# Patient Record
Sex: Female | Born: 1945 | Hispanic: No | Smoking: Never smoker
Health system: Southern US, Community
[De-identification: ages and names within clinical notes are randomized; demographics above are authoritative.]

## PROBLEM LIST (undated history)

## (undated) DIAGNOSIS — K5792 Diverticulitis of intestine, part unspecified, without perforation or abscess without bleeding: Secondary | ICD-10-CM

## (undated) DIAGNOSIS — I10 Essential (primary) hypertension: Secondary | ICD-10-CM

## (undated) DIAGNOSIS — M1990 Unspecified osteoarthritis, unspecified site: Secondary | ICD-10-CM

## (undated) DIAGNOSIS — E119 Type 2 diabetes mellitus without complications: Secondary | ICD-10-CM

## (undated) DIAGNOSIS — C50919 Malignant neoplasm of unspecified site of unspecified female breast: Secondary | ICD-10-CM

## (undated) HISTORY — PX: ABDOMINAL HYSTERECTOMY: SHX81

## (undated) HISTORY — PX: BREAST LUMPECTOMY: SHX2

## (undated) HISTORY — PX: CHOLECYSTECTOMY: SHX55

---

## 2010-11-04 ENCOUNTER — Other Ambulatory Visit
Admission: RE | Admit: 2010-11-04 | Discharge: 2010-11-04 | Payer: Self-pay | Source: Home / Self Care | Admitting: Family Medicine

## 2015-11-17 DIAGNOSIS — K143 Hypertrophy of tongue papillae: Secondary | ICD-10-CM | POA: Diagnosis not present

## 2015-12-24 DIAGNOSIS — L298 Other pruritus: Secondary | ICD-10-CM | POA: Diagnosis not present

## 2016-01-17 DIAGNOSIS — F419 Anxiety disorder, unspecified: Secondary | ICD-10-CM | POA: Diagnosis not present

## 2016-01-17 DIAGNOSIS — I10 Essential (primary) hypertension: Secondary | ICD-10-CM | POA: Diagnosis not present

## 2016-01-17 DIAGNOSIS — E7800 Pure hypercholesterolemia, unspecified: Secondary | ICD-10-CM | POA: Diagnosis not present

## 2016-01-17 DIAGNOSIS — Z7984 Long term (current) use of oral hypoglycemic drugs: Secondary | ICD-10-CM | POA: Diagnosis not present

## 2016-01-17 DIAGNOSIS — E119 Type 2 diabetes mellitus without complications: Secondary | ICD-10-CM | POA: Diagnosis not present

## 2016-01-17 DIAGNOSIS — K149 Disease of tongue, unspecified: Secondary | ICD-10-CM | POA: Diagnosis not present

## 2016-01-21 DIAGNOSIS — N9089 Other specified noninflammatory disorders of vulva and perineum: Secondary | ICD-10-CM | POA: Diagnosis not present

## 2016-02-14 DIAGNOSIS — R319 Hematuria, unspecified: Secondary | ICD-10-CM | POA: Diagnosis not present

## 2016-02-14 DIAGNOSIS — R1030 Lower abdominal pain, unspecified: Secondary | ICD-10-CM | POA: Diagnosis not present

## 2016-02-17 DIAGNOSIS — D72829 Elevated white blood cell count, unspecified: Secondary | ICD-10-CM | POA: Diagnosis not present

## 2016-02-17 DIAGNOSIS — N952 Postmenopausal atrophic vaginitis: Secondary | ICD-10-CM | POA: Diagnosis not present

## 2016-02-17 DIAGNOSIS — R1030 Lower abdominal pain, unspecified: Secondary | ICD-10-CM | POA: Diagnosis not present

## 2016-02-28 DIAGNOSIS — N6489 Other specified disorders of breast: Secondary | ICD-10-CM | POA: Diagnosis not present

## 2016-02-28 DIAGNOSIS — N63 Unspecified lump in breast: Secondary | ICD-10-CM | POA: Diagnosis not present

## 2016-02-28 DIAGNOSIS — Z853 Personal history of malignant neoplasm of breast: Secondary | ICD-10-CM | POA: Diagnosis not present

## 2016-02-28 DIAGNOSIS — Z1231 Encounter for screening mammogram for malignant neoplasm of breast: Secondary | ICD-10-CM | POA: Diagnosis not present

## 2016-03-06 DIAGNOSIS — Z170 Estrogen receptor positive status [ER+]: Secondary | ICD-10-CM | POA: Diagnosis not present

## 2016-03-06 DIAGNOSIS — R682 Dry mouth, unspecified: Secondary | ICD-10-CM | POA: Diagnosis not present

## 2016-03-06 DIAGNOSIS — Z08 Encounter for follow-up examination after completed treatment for malignant neoplasm: Secondary | ICD-10-CM | POA: Diagnosis not present

## 2016-03-06 DIAGNOSIS — T386X5A Adverse effect of antigonadotrophins, antiestrogens, antiandrogens, not elsewhere classified, initial encounter: Secondary | ICD-10-CM | POA: Diagnosis not present

## 2016-03-06 DIAGNOSIS — Z79810 Long term (current) use of selective estrogen receptor modulators (SERMs): Secondary | ICD-10-CM | POA: Diagnosis not present

## 2016-03-06 DIAGNOSIS — Z5181 Encounter for therapeutic drug level monitoring: Secondary | ICD-10-CM | POA: Diagnosis not present

## 2016-03-06 DIAGNOSIS — Z9889 Other specified postprocedural states: Secondary | ICD-10-CM | POA: Diagnosis not present

## 2016-03-06 DIAGNOSIS — D0511 Intraductal carcinoma in situ of right breast: Secondary | ICD-10-CM | POA: Diagnosis not present

## 2016-03-06 DIAGNOSIS — K117 Disturbances of salivary secretion: Secondary | ICD-10-CM | POA: Diagnosis not present

## 2016-03-06 DIAGNOSIS — N952 Postmenopausal atrophic vaginitis: Secondary | ICD-10-CM | POA: Diagnosis not present

## 2016-03-06 DIAGNOSIS — Z853 Personal history of malignant neoplasm of breast: Secondary | ICD-10-CM | POA: Diagnosis not present

## 2016-03-16 DIAGNOSIS — H04123 Dry eye syndrome of bilateral lacrimal glands: Secondary | ICD-10-CM | POA: Diagnosis not present

## 2016-03-16 DIAGNOSIS — H40013 Open angle with borderline findings, low risk, bilateral: Secondary | ICD-10-CM | POA: Diagnosis not present

## 2016-03-20 DIAGNOSIS — R358 Other polyuria: Secondary | ICD-10-CM | POA: Diagnosis not present

## 2016-03-20 DIAGNOSIS — R300 Dysuria: Secondary | ICD-10-CM | POA: Diagnosis not present

## 2016-03-20 DIAGNOSIS — N200 Calculus of kidney: Secondary | ICD-10-CM | POA: Diagnosis not present

## 2016-03-31 DIAGNOSIS — R1084 Generalized abdominal pain: Secondary | ICD-10-CM | POA: Diagnosis not present

## 2016-03-31 DIAGNOSIS — N23 Unspecified renal colic: Secondary | ICD-10-CM | POA: Diagnosis not present

## 2016-03-31 DIAGNOSIS — N3010 Interstitial cystitis (chronic) without hematuria: Secondary | ICD-10-CM | POA: Diagnosis not present

## 2016-03-31 DIAGNOSIS — N200 Calculus of kidney: Secondary | ICD-10-CM | POA: Diagnosis not present

## 2016-07-19 DIAGNOSIS — Z23 Encounter for immunization: Secondary | ICD-10-CM | POA: Diagnosis not present

## 2016-07-19 DIAGNOSIS — F419 Anxiety disorder, unspecified: Secondary | ICD-10-CM | POA: Diagnosis not present

## 2016-07-19 DIAGNOSIS — R319 Hematuria, unspecified: Secondary | ICD-10-CM | POA: Diagnosis not present

## 2016-07-19 DIAGNOSIS — Z7984 Long term (current) use of oral hypoglycemic drugs: Secondary | ICD-10-CM | POA: Diagnosis not present

## 2016-07-19 DIAGNOSIS — E119 Type 2 diabetes mellitus without complications: Secondary | ICD-10-CM | POA: Diagnosis not present

## 2016-07-19 DIAGNOSIS — I10 Essential (primary) hypertension: Secondary | ICD-10-CM | POA: Diagnosis not present

## 2016-07-19 DIAGNOSIS — E7800 Pure hypercholesterolemia, unspecified: Secondary | ICD-10-CM | POA: Diagnosis not present

## 2016-08-29 DIAGNOSIS — Z86000 Personal history of in-situ neoplasm of breast: Secondary | ICD-10-CM | POA: Diagnosis not present

## 2016-08-29 DIAGNOSIS — Z08 Encounter for follow-up examination after completed treatment for malignant neoplasm: Secondary | ICD-10-CM | POA: Diagnosis not present

## 2016-08-29 DIAGNOSIS — Z79810 Long term (current) use of selective estrogen receptor modulators (SERMs): Secondary | ICD-10-CM | POA: Diagnosis not present

## 2016-08-29 DIAGNOSIS — Z5181 Encounter for therapeutic drug level monitoring: Secondary | ICD-10-CM | POA: Diagnosis not present

## 2016-08-29 DIAGNOSIS — D0511 Intraductal carcinoma in situ of right breast: Secondary | ICD-10-CM | POA: Diagnosis not present

## 2016-08-29 DIAGNOSIS — Z09 Encounter for follow-up examination after completed treatment for conditions other than malignant neoplasm: Secondary | ICD-10-CM | POA: Diagnosis not present

## 2016-08-29 DIAGNOSIS — N952 Postmenopausal atrophic vaginitis: Secondary | ICD-10-CM | POA: Diagnosis not present

## 2016-08-29 DIAGNOSIS — Z853 Personal history of malignant neoplasm of breast: Secondary | ICD-10-CM | POA: Diagnosis not present

## 2016-09-04 DIAGNOSIS — N390 Urinary tract infection, site not specified: Secondary | ICD-10-CM | POA: Diagnosis not present

## 2016-09-04 DIAGNOSIS — N952 Postmenopausal atrophic vaginitis: Secondary | ICD-10-CM | POA: Diagnosis not present

## 2016-09-21 DIAGNOSIS — H04123 Dry eye syndrome of bilateral lacrimal glands: Secondary | ICD-10-CM | POA: Diagnosis not present

## 2016-09-21 DIAGNOSIS — H43393 Other vitreous opacities, bilateral: Secondary | ICD-10-CM | POA: Diagnosis not present

## 2016-09-21 DIAGNOSIS — H524 Presbyopia: Secondary | ICD-10-CM | POA: Diagnosis not present

## 2016-09-21 DIAGNOSIS — E119 Type 2 diabetes mellitus without complications: Secondary | ICD-10-CM | POA: Diagnosis not present

## 2016-09-21 DIAGNOSIS — H2513 Age-related nuclear cataract, bilateral: Secondary | ICD-10-CM | POA: Diagnosis not present

## 2016-09-21 DIAGNOSIS — H40013 Open angle with borderline findings, low risk, bilateral: Secondary | ICD-10-CM | POA: Diagnosis not present

## 2016-11-03 DIAGNOSIS — R3129 Other microscopic hematuria: Secondary | ICD-10-CM | POA: Diagnosis not present

## 2016-11-03 DIAGNOSIS — R823 Hemoglobinuria: Secondary | ICD-10-CM | POA: Diagnosis not present

## 2016-11-21 DIAGNOSIS — N3010 Interstitial cystitis (chronic) without hematuria: Secondary | ICD-10-CM | POA: Diagnosis not present

## 2017-01-17 DIAGNOSIS — F419 Anxiety disorder, unspecified: Secondary | ICD-10-CM | POA: Diagnosis not present

## 2017-01-17 DIAGNOSIS — Z7984 Long term (current) use of oral hypoglycemic drugs: Secondary | ICD-10-CM | POA: Diagnosis not present

## 2017-01-17 DIAGNOSIS — M158 Other polyosteoarthritis: Secondary | ICD-10-CM | POA: Diagnosis not present

## 2017-01-17 DIAGNOSIS — R319 Hematuria, unspecified: Secondary | ICD-10-CM | POA: Diagnosis not present

## 2017-01-17 DIAGNOSIS — E119 Type 2 diabetes mellitus without complications: Secondary | ICD-10-CM | POA: Diagnosis not present

## 2017-01-17 DIAGNOSIS — I10 Essential (primary) hypertension: Secondary | ICD-10-CM | POA: Diagnosis not present

## 2017-01-17 DIAGNOSIS — E7800 Pure hypercholesterolemia, unspecified: Secondary | ICD-10-CM | POA: Diagnosis not present

## 2017-03-14 DIAGNOSIS — Z853 Personal history of malignant neoplasm of breast: Secondary | ICD-10-CM | POA: Diagnosis not present

## 2017-03-14 DIAGNOSIS — R928 Other abnormal and inconclusive findings on diagnostic imaging of breast: Secondary | ICD-10-CM | POA: Diagnosis not present

## 2017-03-27 DIAGNOSIS — L718 Other rosacea: Secondary | ICD-10-CM | POA: Diagnosis not present

## 2017-03-27 DIAGNOSIS — L570 Actinic keratosis: Secondary | ICD-10-CM | POA: Diagnosis not present

## 2017-04-05 DIAGNOSIS — R350 Frequency of micturition: Secondary | ICD-10-CM | POA: Diagnosis not present

## 2017-04-05 DIAGNOSIS — N3090 Cystitis, unspecified without hematuria: Secondary | ICD-10-CM | POA: Diagnosis not present

## 2017-04-12 DIAGNOSIS — H40013 Open angle with borderline findings, low risk, bilateral: Secondary | ICD-10-CM | POA: Diagnosis not present

## 2017-04-12 DIAGNOSIS — H04123 Dry eye syndrome of bilateral lacrimal glands: Secondary | ICD-10-CM | POA: Diagnosis not present

## 2017-04-24 DIAGNOSIS — R300 Dysuria: Secondary | ICD-10-CM | POA: Diagnosis not present

## 2017-04-30 DIAGNOSIS — B373 Candidiasis of vulva and vagina: Secondary | ICD-10-CM | POA: Diagnosis not present

## 2017-04-30 DIAGNOSIS — N819 Female genital prolapse, unspecified: Secondary | ICD-10-CM | POA: Diagnosis not present

## 2017-05-28 DIAGNOSIS — R300 Dysuria: Secondary | ICD-10-CM | POA: Diagnosis not present

## 2017-05-28 DIAGNOSIS — Z87440 Personal history of urinary (tract) infections: Secondary | ICD-10-CM | POA: Diagnosis not present

## 2017-05-28 DIAGNOSIS — N200 Calculus of kidney: Secondary | ICD-10-CM | POA: Diagnosis not present

## 2017-05-28 DIAGNOSIS — R3915 Urgency of urination: Secondary | ICD-10-CM | POA: Diagnosis not present

## 2017-05-28 DIAGNOSIS — N390 Urinary tract infection, site not specified: Secondary | ICD-10-CM | POA: Diagnosis not present

## 2017-07-12 DIAGNOSIS — N200 Calculus of kidney: Secondary | ICD-10-CM | POA: Diagnosis not present

## 2017-07-12 DIAGNOSIS — R3915 Urgency of urination: Secondary | ICD-10-CM | POA: Diagnosis not present

## 2017-07-12 DIAGNOSIS — Z87440 Personal history of urinary (tract) infections: Secondary | ICD-10-CM | POA: Diagnosis not present

## 2017-07-25 DIAGNOSIS — Z23 Encounter for immunization: Secondary | ICD-10-CM | POA: Diagnosis not present

## 2017-07-25 DIAGNOSIS — H6120 Impacted cerumen, unspecified ear: Secondary | ICD-10-CM | POA: Diagnosis not present

## 2017-07-25 DIAGNOSIS — E7800 Pure hypercholesterolemia, unspecified: Secondary | ICD-10-CM | POA: Diagnosis not present

## 2017-07-25 DIAGNOSIS — E119 Type 2 diabetes mellitus without complications: Secondary | ICD-10-CM | POA: Diagnosis not present

## 2017-07-25 DIAGNOSIS — M158 Other polyosteoarthritis: Secondary | ICD-10-CM | POA: Diagnosis not present

## 2017-07-25 DIAGNOSIS — Z7984 Long term (current) use of oral hypoglycemic drugs: Secondary | ICD-10-CM | POA: Diagnosis not present

## 2017-07-25 DIAGNOSIS — R319 Hematuria, unspecified: Secondary | ICD-10-CM | POA: Diagnosis not present

## 2017-07-25 DIAGNOSIS — F419 Anxiety disorder, unspecified: Secondary | ICD-10-CM | POA: Diagnosis not present

## 2017-07-25 DIAGNOSIS — I10 Essential (primary) hypertension: Secondary | ICD-10-CM | POA: Diagnosis not present

## 2017-09-21 DIAGNOSIS — Z853 Personal history of malignant neoplasm of breast: Secondary | ICD-10-CM | POA: Diagnosis not present

## 2017-09-21 DIAGNOSIS — D0511 Intraductal carcinoma in situ of right breast: Secondary | ICD-10-CM | POA: Diagnosis not present

## 2017-10-23 DIAGNOSIS — K5792 Diverticulitis of intestine, part unspecified, without perforation or abscess without bleeding: Secondary | ICD-10-CM | POA: Diagnosis not present

## 2017-10-23 DIAGNOSIS — E119 Type 2 diabetes mellitus without complications: Secondary | ICD-10-CM | POA: Diagnosis not present

## 2017-11-01 DIAGNOSIS — H2513 Age-related nuclear cataract, bilateral: Secondary | ICD-10-CM | POA: Diagnosis not present

## 2017-11-01 DIAGNOSIS — H40013 Open angle with borderline findings, low risk, bilateral: Secondary | ICD-10-CM | POA: Diagnosis not present

## 2017-11-01 DIAGNOSIS — E119 Type 2 diabetes mellitus without complications: Secondary | ICD-10-CM | POA: Diagnosis not present

## 2017-11-01 DIAGNOSIS — Z7984 Long term (current) use of oral hypoglycemic drugs: Secondary | ICD-10-CM | POA: Diagnosis not present

## 2018-02-14 DIAGNOSIS — Z7984 Long term (current) use of oral hypoglycemic drugs: Secondary | ICD-10-CM | POA: Diagnosis not present

## 2018-02-14 DIAGNOSIS — E119 Type 2 diabetes mellitus without complications: Secondary | ICD-10-CM | POA: Diagnosis not present

## 2018-02-14 DIAGNOSIS — N3001 Acute cystitis with hematuria: Secondary | ICD-10-CM | POA: Diagnosis not present

## 2018-02-14 DIAGNOSIS — R350 Frequency of micturition: Secondary | ICD-10-CM | POA: Diagnosis not present

## 2018-02-27 DIAGNOSIS — M158 Other polyosteoarthritis: Secondary | ICD-10-CM | POA: Diagnosis not present

## 2018-02-27 DIAGNOSIS — N3010 Interstitial cystitis (chronic) without hematuria: Secondary | ICD-10-CM | POA: Diagnosis not present

## 2018-02-27 DIAGNOSIS — L719 Rosacea, unspecified: Secondary | ICD-10-CM | POA: Diagnosis not present

## 2018-02-27 DIAGNOSIS — I10 Essential (primary) hypertension: Secondary | ICD-10-CM | POA: Diagnosis not present

## 2018-02-27 DIAGNOSIS — E119 Type 2 diabetes mellitus without complications: Secondary | ICD-10-CM | POA: Diagnosis not present

## 2018-02-27 DIAGNOSIS — R8290 Unspecified abnormal findings in urine: Secondary | ICD-10-CM | POA: Diagnosis not present

## 2018-02-27 DIAGNOSIS — E7800 Pure hypercholesterolemia, unspecified: Secondary | ICD-10-CM | POA: Diagnosis not present

## 2018-02-27 DIAGNOSIS — F419 Anxiety disorder, unspecified: Secondary | ICD-10-CM | POA: Diagnosis not present

## 2018-02-27 DIAGNOSIS — M545 Low back pain: Secondary | ICD-10-CM | POA: Diagnosis not present

## 2018-03-01 DIAGNOSIS — L718 Other rosacea: Secondary | ICD-10-CM | POA: Diagnosis not present

## 2018-03-01 DIAGNOSIS — L821 Other seborrheic keratosis: Secondary | ICD-10-CM | POA: Diagnosis not present

## 2018-03-27 DIAGNOSIS — N200 Calculus of kidney: Secondary | ICD-10-CM | POA: Diagnosis not present

## 2018-03-27 DIAGNOSIS — R3915 Urgency of urination: Secondary | ICD-10-CM | POA: Diagnosis not present

## 2018-03-27 DIAGNOSIS — Z87440 Personal history of urinary (tract) infections: Secondary | ICD-10-CM | POA: Diagnosis not present

## 2018-04-04 DIAGNOSIS — Z853 Personal history of malignant neoplasm of breast: Secondary | ICD-10-CM | POA: Diagnosis not present

## 2018-04-04 DIAGNOSIS — R928 Other abnormal and inconclusive findings on diagnostic imaging of breast: Secondary | ICD-10-CM | POA: Diagnosis not present

## 2018-05-03 DIAGNOSIS — H16223 Keratoconjunctivitis sicca, not specified as Sjogren's, bilateral: Secondary | ICD-10-CM | POA: Diagnosis not present

## 2018-05-03 DIAGNOSIS — H40013 Open angle with borderline findings, low risk, bilateral: Secondary | ICD-10-CM | POA: Diagnosis not present

## 2018-09-02 DIAGNOSIS — Z23 Encounter for immunization: Secondary | ICD-10-CM | POA: Diagnosis not present

## 2018-09-02 DIAGNOSIS — F419 Anxiety disorder, unspecified: Secondary | ICD-10-CM | POA: Diagnosis not present

## 2018-09-02 DIAGNOSIS — J3489 Other specified disorders of nose and nasal sinuses: Secondary | ICD-10-CM | POA: Diagnosis not present

## 2018-09-02 DIAGNOSIS — Z7984 Long term (current) use of oral hypoglycemic drugs: Secondary | ICD-10-CM | POA: Diagnosis not present

## 2018-09-02 DIAGNOSIS — M158 Other polyosteoarthritis: Secondary | ICD-10-CM | POA: Diagnosis not present

## 2018-09-02 DIAGNOSIS — E7800 Pure hypercholesterolemia, unspecified: Secondary | ICD-10-CM | POA: Diagnosis not present

## 2018-09-02 DIAGNOSIS — N3010 Interstitial cystitis (chronic) without hematuria: Secondary | ICD-10-CM | POA: Diagnosis not present

## 2018-09-02 DIAGNOSIS — I10 Essential (primary) hypertension: Secondary | ICD-10-CM | POA: Diagnosis not present

## 2018-09-02 DIAGNOSIS — E119 Type 2 diabetes mellitus without complications: Secondary | ICD-10-CM | POA: Diagnosis not present

## 2019-03-28 DIAGNOSIS — F419 Anxiety disorder, unspecified: Secondary | ICD-10-CM | POA: Diagnosis not present

## 2019-03-28 DIAGNOSIS — E119 Type 2 diabetes mellitus without complications: Secondary | ICD-10-CM | POA: Diagnosis not present

## 2019-03-28 DIAGNOSIS — E7800 Pure hypercholesterolemia, unspecified: Secondary | ICD-10-CM | POA: Diagnosis not present

## 2019-03-28 DIAGNOSIS — N3010 Interstitial cystitis (chronic) without hematuria: Secondary | ICD-10-CM | POA: Diagnosis not present

## 2019-03-28 DIAGNOSIS — M158 Other polyosteoarthritis: Secondary | ICD-10-CM | POA: Diagnosis not present

## 2019-03-28 DIAGNOSIS — I10 Essential (primary) hypertension: Secondary | ICD-10-CM | POA: Diagnosis not present

## 2019-04-09 DIAGNOSIS — Z1231 Encounter for screening mammogram for malignant neoplasm of breast: Secondary | ICD-10-CM | POA: Diagnosis not present

## 2019-04-11 DIAGNOSIS — Z170 Estrogen receptor positive status [ER+]: Secondary | ICD-10-CM | POA: Diagnosis not present

## 2019-04-11 DIAGNOSIS — Z79810 Long term (current) use of selective estrogen receptor modulators (SERMs): Secondary | ICD-10-CM | POA: Diagnosis not present

## 2019-04-11 DIAGNOSIS — D0511 Intraductal carcinoma in situ of right breast: Secondary | ICD-10-CM | POA: Diagnosis not present

## 2019-05-20 DIAGNOSIS — R1030 Lower abdominal pain, unspecified: Secondary | ICD-10-CM | POA: Diagnosis not present

## 2019-06-30 DIAGNOSIS — H524 Presbyopia: Secondary | ICD-10-CM | POA: Diagnosis not present

## 2019-06-30 DIAGNOSIS — H43393 Other vitreous opacities, bilateral: Secondary | ICD-10-CM | POA: Diagnosis not present

## 2019-06-30 DIAGNOSIS — H52203 Unspecified astigmatism, bilateral: Secondary | ICD-10-CM | POA: Diagnosis not present

## 2019-06-30 DIAGNOSIS — H16223 Keratoconjunctivitis sicca, not specified as Sjogren's, bilateral: Secondary | ICD-10-CM | POA: Diagnosis not present

## 2019-06-30 DIAGNOSIS — H2513 Age-related nuclear cataract, bilateral: Secondary | ICD-10-CM | POA: Diagnosis not present

## 2019-06-30 DIAGNOSIS — Z7984 Long term (current) use of oral hypoglycemic drugs: Secondary | ICD-10-CM | POA: Diagnosis not present

## 2019-06-30 DIAGNOSIS — H5203 Hypermetropia, bilateral: Secondary | ICD-10-CM | POA: Diagnosis not present

## 2019-06-30 DIAGNOSIS — E119 Type 2 diabetes mellitus without complications: Secondary | ICD-10-CM | POA: Diagnosis not present

## 2019-06-30 DIAGNOSIS — H40013 Open angle with borderline findings, low risk, bilateral: Secondary | ICD-10-CM | POA: Diagnosis not present

## 2019-08-06 DIAGNOSIS — E7800 Pure hypercholesterolemia, unspecified: Secondary | ICD-10-CM | POA: Diagnosis not present

## 2019-08-06 DIAGNOSIS — M158 Other polyosteoarthritis: Secondary | ICD-10-CM | POA: Diagnosis not present

## 2019-08-06 DIAGNOSIS — I10 Essential (primary) hypertension: Secondary | ICD-10-CM | POA: Diagnosis not present

## 2019-08-06 DIAGNOSIS — Z853 Personal history of malignant neoplasm of breast: Secondary | ICD-10-CM | POA: Diagnosis not present

## 2019-10-21 DIAGNOSIS — F419 Anxiety disorder, unspecified: Secondary | ICD-10-CM | POA: Diagnosis not present

## 2019-10-21 DIAGNOSIS — E7800 Pure hypercholesterolemia, unspecified: Secondary | ICD-10-CM | POA: Diagnosis not present

## 2019-10-21 DIAGNOSIS — I10 Essential (primary) hypertension: Secondary | ICD-10-CM | POA: Diagnosis not present

## 2019-10-21 DIAGNOSIS — M158 Other polyosteoarthritis: Secondary | ICD-10-CM | POA: Diagnosis not present

## 2019-10-21 DIAGNOSIS — Z853 Personal history of malignant neoplasm of breast: Secondary | ICD-10-CM | POA: Diagnosis not present

## 2019-10-21 DIAGNOSIS — Z0000 Encounter for general adult medical examination without abnormal findings: Secondary | ICD-10-CM | POA: Diagnosis not present

## 2019-10-21 DIAGNOSIS — Z1211 Encounter for screening for malignant neoplasm of colon: Secondary | ICD-10-CM | POA: Diagnosis not present

## 2019-10-21 DIAGNOSIS — E119 Type 2 diabetes mellitus without complications: Secondary | ICD-10-CM | POA: Diagnosis not present

## 2019-10-22 DIAGNOSIS — R1030 Lower abdominal pain, unspecified: Secondary | ICD-10-CM | POA: Diagnosis not present

## 2019-10-22 DIAGNOSIS — W19XXXA Unspecified fall, initial encounter: Secondary | ICD-10-CM | POA: Diagnosis not present

## 2019-10-31 DIAGNOSIS — M1611 Unilateral primary osteoarthritis, right hip: Secondary | ICD-10-CM | POA: Diagnosis not present

## 2019-10-31 DIAGNOSIS — M7061 Trochanteric bursitis, right hip: Secondary | ICD-10-CM | POA: Diagnosis not present

## 2019-10-31 DIAGNOSIS — M85851 Other specified disorders of bone density and structure, right thigh: Secondary | ICD-10-CM | POA: Diagnosis not present

## 2019-11-06 DIAGNOSIS — E7800 Pure hypercholesterolemia, unspecified: Secondary | ICD-10-CM | POA: Diagnosis not present

## 2019-11-06 DIAGNOSIS — M158 Other polyosteoarthritis: Secondary | ICD-10-CM | POA: Diagnosis not present

## 2019-11-06 DIAGNOSIS — E119 Type 2 diabetes mellitus without complications: Secondary | ICD-10-CM | POA: Diagnosis not present

## 2019-11-06 DIAGNOSIS — I10 Essential (primary) hypertension: Secondary | ICD-10-CM | POA: Diagnosis not present

## 2019-11-06 DIAGNOSIS — Z853 Personal history of malignant neoplasm of breast: Secondary | ICD-10-CM | POA: Diagnosis not present

## 2019-11-26 DIAGNOSIS — M8589 Other specified disorders of bone density and structure, multiple sites: Secondary | ICD-10-CM | POA: Diagnosis not present

## 2019-11-26 DIAGNOSIS — M810 Age-related osteoporosis without current pathological fracture: Secondary | ICD-10-CM | POA: Diagnosis not present

## 2019-11-26 DIAGNOSIS — Z780 Asymptomatic menopausal state: Secondary | ICD-10-CM | POA: Diagnosis not present

## 2020-01-06 DIAGNOSIS — H0100B Unspecified blepharitis left eye, upper and lower eyelids: Secondary | ICD-10-CM | POA: Diagnosis not present

## 2020-01-06 DIAGNOSIS — H52203 Unspecified astigmatism, bilateral: Secondary | ICD-10-CM | POA: Diagnosis not present

## 2020-01-06 DIAGNOSIS — H16223 Keratoconjunctivitis sicca, not specified as Sjogren's, bilateral: Secondary | ICD-10-CM | POA: Diagnosis not present

## 2020-01-06 DIAGNOSIS — Z8679 Personal history of other diseases of the circulatory system: Secondary | ICD-10-CM | POA: Diagnosis not present

## 2020-01-06 DIAGNOSIS — H2513 Age-related nuclear cataract, bilateral: Secondary | ICD-10-CM | POA: Diagnosis not present

## 2020-01-06 DIAGNOSIS — H40013 Open angle with borderline findings, low risk, bilateral: Secondary | ICD-10-CM | POA: Diagnosis not present

## 2020-01-06 DIAGNOSIS — H524 Presbyopia: Secondary | ICD-10-CM | POA: Diagnosis not present

## 2020-01-06 DIAGNOSIS — Z83518 Family history of other specified eye disorder: Secondary | ICD-10-CM | POA: Diagnosis not present

## 2020-01-06 DIAGNOSIS — H5203 Hypermetropia, bilateral: Secondary | ICD-10-CM | POA: Diagnosis not present

## 2020-01-06 DIAGNOSIS — H0100A Unspecified blepharitis right eye, upper and lower eyelids: Secondary | ICD-10-CM | POA: Diagnosis not present

## 2020-01-13 DIAGNOSIS — E7800 Pure hypercholesterolemia, unspecified: Secondary | ICD-10-CM | POA: Diagnosis not present

## 2020-01-13 DIAGNOSIS — M158 Other polyosteoarthritis: Secondary | ICD-10-CM | POA: Diagnosis not present

## 2020-01-13 DIAGNOSIS — Z853 Personal history of malignant neoplasm of breast: Secondary | ICD-10-CM | POA: Diagnosis not present

## 2020-01-13 DIAGNOSIS — I10 Essential (primary) hypertension: Secondary | ICD-10-CM | POA: Diagnosis not present

## 2020-03-10 DIAGNOSIS — E7800 Pure hypercholesterolemia, unspecified: Secondary | ICD-10-CM | POA: Diagnosis not present

## 2020-03-10 DIAGNOSIS — E119 Type 2 diabetes mellitus without complications: Secondary | ICD-10-CM | POA: Diagnosis not present

## 2020-03-10 DIAGNOSIS — M158 Other polyosteoarthritis: Secondary | ICD-10-CM | POA: Diagnosis not present

## 2020-03-10 DIAGNOSIS — I10 Essential (primary) hypertension: Secondary | ICD-10-CM | POA: Diagnosis not present

## 2020-03-10 DIAGNOSIS — Z853 Personal history of malignant neoplasm of breast: Secondary | ICD-10-CM | POA: Diagnosis not present

## 2020-04-09 DIAGNOSIS — Z1231 Encounter for screening mammogram for malignant neoplasm of breast: Secondary | ICD-10-CM | POA: Diagnosis not present

## 2020-04-12 DIAGNOSIS — Z08 Encounter for follow-up examination after completed treatment for malignant neoplasm: Secondary | ICD-10-CM | POA: Diagnosis not present

## 2020-04-12 DIAGNOSIS — Z853 Personal history of malignant neoplasm of breast: Secondary | ICD-10-CM | POA: Diagnosis not present

## 2020-04-12 DIAGNOSIS — D0511 Intraductal carcinoma in situ of right breast: Secondary | ICD-10-CM | POA: Diagnosis not present

## 2020-04-20 DIAGNOSIS — E7800 Pure hypercholesterolemia, unspecified: Secondary | ICD-10-CM | POA: Diagnosis not present

## 2020-04-20 DIAGNOSIS — F329 Major depressive disorder, single episode, unspecified: Secondary | ICD-10-CM | POA: Diagnosis not present

## 2020-04-20 DIAGNOSIS — R0989 Other specified symptoms and signs involving the circulatory and respiratory systems: Secondary | ICD-10-CM | POA: Diagnosis not present

## 2020-04-20 DIAGNOSIS — Z853 Personal history of malignant neoplasm of breast: Secondary | ICD-10-CM | POA: Diagnosis not present

## 2020-04-20 DIAGNOSIS — M158 Other polyosteoarthritis: Secondary | ICD-10-CM | POA: Diagnosis not present

## 2020-04-20 DIAGNOSIS — I10 Essential (primary) hypertension: Secondary | ICD-10-CM | POA: Diagnosis not present

## 2020-04-20 DIAGNOSIS — E119 Type 2 diabetes mellitus without complications: Secondary | ICD-10-CM | POA: Diagnosis not present

## 2020-05-05 DIAGNOSIS — R0989 Other specified symptoms and signs involving the circulatory and respiratory systems: Secondary | ICD-10-CM | POA: Diagnosis not present

## 2020-05-05 DIAGNOSIS — H6121 Impacted cerumen, right ear: Secondary | ICD-10-CM | POA: Diagnosis not present

## 2020-05-05 DIAGNOSIS — H109 Unspecified conjunctivitis: Secondary | ICD-10-CM | POA: Diagnosis not present

## 2020-05-05 DIAGNOSIS — R030 Elevated blood-pressure reading, without diagnosis of hypertension: Secondary | ICD-10-CM | POA: Diagnosis not present

## 2020-05-07 ENCOUNTER — Encounter (HOSPITAL_BASED_OUTPATIENT_CLINIC_OR_DEPARTMENT_OTHER): Payer: Self-pay

## 2020-05-07 ENCOUNTER — Emergency Department (HOSPITAL_BASED_OUTPATIENT_CLINIC_OR_DEPARTMENT_OTHER): Payer: PPO

## 2020-05-07 ENCOUNTER — Other Ambulatory Visit: Payer: Self-pay

## 2020-05-07 ENCOUNTER — Emergency Department (HOSPITAL_BASED_OUTPATIENT_CLINIC_OR_DEPARTMENT_OTHER)
Admission: EM | Admit: 2020-05-07 | Discharge: 2020-05-07 | Disposition: A | Discharge status: Home / Self Care | Payer: PPO | Attending: Emergency Medicine | Admitting: Emergency Medicine

## 2020-05-07 DIAGNOSIS — K5792 Diverticulitis of intestine, part unspecified, without perforation or abscess without bleeding: Secondary | ICD-10-CM | POA: Diagnosis not present

## 2020-05-07 DIAGNOSIS — E119 Type 2 diabetes mellitus without complications: Secondary | ICD-10-CM | POA: Diagnosis not present

## 2020-05-07 DIAGNOSIS — Z853 Personal history of malignant neoplasm of breast: Secondary | ICD-10-CM | POA: Insufficient documentation

## 2020-05-07 DIAGNOSIS — I10 Essential (primary) hypertension: Secondary | ICD-10-CM | POA: Insufficient documentation

## 2020-05-07 DIAGNOSIS — Z79899 Other long term (current) drug therapy: Secondary | ICD-10-CM | POA: Insufficient documentation

## 2020-05-07 DIAGNOSIS — K5732 Diverticulitis of large intestine without perforation or abscess without bleeding: Secondary | ICD-10-CM | POA: Diagnosis not present

## 2020-05-07 DIAGNOSIS — R1032 Left lower quadrant pain: Secondary | ICD-10-CM | POA: Diagnosis not present

## 2020-05-07 DIAGNOSIS — Z7984 Long term (current) use of oral hypoglycemic drugs: Secondary | ICD-10-CM | POA: Insufficient documentation

## 2020-05-07 DIAGNOSIS — R6883 Chills (without fever): Secondary | ICD-10-CM | POA: Diagnosis not present

## 2020-05-07 HISTORY — DX: Malignant neoplasm of unspecified site of unspecified female breast: C50.919

## 2020-05-07 HISTORY — DX: Unspecified osteoarthritis, unspecified site: M19.90

## 2020-05-07 HISTORY — DX: Diverticulitis of intestine, part unspecified, without perforation or abscess without bleeding: K57.92

## 2020-05-07 HISTORY — DX: Essential (primary) hypertension: I10

## 2020-05-07 HISTORY — DX: Type 2 diabetes mellitus without complications: E11.9

## 2020-05-07 LAB — COMPREHENSIVE METABOLIC PANEL
ALT: 25 U/L (ref 0–44)
AST: 17 U/L (ref 15–41)
Albumin: 4.1 g/dL (ref 3.5–5.0)
Alkaline Phosphatase: 72 U/L (ref 38–126)
Anion gap: 13 (ref 5–15)
BUN: 17 mg/dL (ref 8–23)
CO2: 23 mmol/L (ref 22–32)
Calcium: 9 mg/dL (ref 8.9–10.3)
Chloride: 97 mmol/L — ABNORMAL LOW (ref 98–111)
Creatinine, Ser: 0.61 mg/dL (ref 0.44–1.00)
GFR calc Af Amer: >60 mL/min (ref >60)
GFR calc non Af Amer: >60 mL/min (ref >60)
Glucose, Bld: 131 mg/dL — ABNORMAL HIGH (ref 70–99)
Potassium: 4 mmol/L (ref 3.5–5.1)
Sodium: 133 mmol/L — ABNORMAL LOW (ref 135–145)
Total Bilirubin: 0.4 mg/dL (ref 0.3–1.2)
Total Protein: 7.4 g/dL (ref 6.5–8.1)

## 2020-05-07 LAB — URINALYSIS, ROUTINE W REFLEX MICROSCOPIC
Bilirubin Urine: NEGATIVE
Glucose, UA: NEGATIVE mg/dL
Ketones, ur: NEGATIVE mg/dL
Leukocytes,Ua: NEGATIVE
Nitrite: NEGATIVE
Protein, ur: NEGATIVE mg/dL
Specific Gravity, Urine: 1.025 (ref 1.005–1.030)
pH: 5.5 (ref 5.0–8.0)

## 2020-05-07 LAB — CBC WITH DIFFERENTIAL/PLATELET
Abs Immature Granulocytes: 0.05 10*3/uL (ref 0.00–0.07)
Basophils Absolute: 0 10*3/uL (ref 0.0–0.1)
Basophils Relative: 0 %
Eosinophils Absolute: 0.1 10*3/uL (ref 0.0–0.5)
Eosinophils Relative: 1 %
HCT: 37.7 % (ref 36.0–46.0)
Hemoglobin: 12.5 g/dL (ref 12.0–15.0)
Immature Granulocytes: 1 %
Lymphocytes Relative: 22 %
Lymphs Abs: 2.3 10*3/uL (ref 0.7–4.0)
MCH: 28 pg (ref 26.0–34.0)
MCHC: 33.2 g/dL (ref 30.0–36.0)
MCV: 84.5 fL (ref 80.0–100.0)
Monocytes Absolute: 0.9 10*3/uL (ref 0.1–1.0)
Monocytes Relative: 8 %
Neutro Abs: 7.3 10*3/uL (ref 1.7–7.7)
Neutrophils Relative %: 68 %
Platelets: 212 10*3/uL (ref 150–400)
RBC: 4.46 MIL/uL (ref 3.87–5.11)
RDW: 12.8 % (ref 11.5–15.5)
WBC: 10.6 10*3/uL — ABNORMAL HIGH (ref 4.0–10.5)
nRBC: 0 % (ref 0.0–0.2)

## 2020-05-07 LAB — URINALYSIS, MICROSCOPIC (REFLEX)

## 2020-05-07 LAB — LIPASE, BLOOD: Lipase: 26 U/L (ref 11–51)

## 2020-05-07 MED ORDER — FENTANYL CITRATE (PF) 100 MCG/2ML IJ SOLN
50.0000 ug | Freq: Once | INTRAMUSCULAR | Status: AC
  Administered 2020-05-07: 50 ug via INTRAVENOUS
  Filled 2020-05-07: qty 2

## 2020-05-07 MED ORDER — AMOXICILLIN-POT CLAVULANATE 875-125 MG PO TABS: 1.0000 | ORAL_TABLET | Freq: Two times a day (BID) | ORAL | 0 refills | Status: AC

## 2020-05-07 MED ORDER — ONDANSETRON HCL 4 MG PO TABS: 4.0000 mg | ORAL_TABLET | Freq: Three times a day (TID) | ORAL | 0 refills | Status: AC | PRN

## 2020-05-07 MED ORDER — SODIUM CHLORIDE 0.9 % IV BOLUS
1000.0000 mL | Freq: Once | INTRAVENOUS | Status: AC
  Administered 2020-05-07: 1000 mL via INTRAVENOUS

## 2020-05-07 MED ORDER — ONDANSETRON HCL 4 MG/2ML IJ SOLN
4.0000 mg | Freq: Once | INTRAMUSCULAR | Status: AC
  Administered 2020-05-07: 4 mg via INTRAVENOUS
  Filled 2020-05-07: qty 2

## 2020-05-07 MED ORDER — AMOXICILLIN-POT CLAVULANATE 875-125 MG PO TABS
1.0000 | ORAL_TABLET | Freq: Once | ORAL | Status: AC
  Administered 2020-05-07: 1 via ORAL
  Filled 2020-05-07: qty 1

## 2020-05-07 MED ORDER — IOHEXOL 300 MG/ML  SOLN
100.0000 mL | Freq: Once | INTRAMUSCULAR | Status: AC | PRN
  Administered 2020-05-07: 100 mL via INTRAVENOUS

## 2020-05-07 NOTE — ED Triage Notes (Signed)
Pt believes she is having diverticulitis flare up, LLQ pain that has now began to radiate up abd.

## 2020-05-07 NOTE — ED Provider Notes (Signed)
[facility] EMERGENCY DEPARTMENT Provider Note   CSN: [id number] Arrival date & time: 05/07/20  1827     History Chief Complaint  Patient presents with   Abdominal Pain    Tammy Gilbert is a 74 y.o. female.  The history is provided by the patient.  Abdominal Pain Pain location:  LLQ Pain quality: not aching   Pain radiates to:  Does not radiate Pain severity:  Moderate Onset quality:  Gradual Timing:  Constant Chronicity:  New Context: suspicious food intake   Context: not sick contacts   Relieved by:  Nothing Worsened by:  Nothing Associated symptoms: constipation and nausea   Associated symptoms: no anorexia, no belching, no chest pain, no chills, no cough, no dysuria, no fever, no hematuria, no shortness of breath, no sore throat and no vomiting   Risk factors: has not had multiple surgeries   Risk factors comment:  Hx of diverticulitis      Past Medical History:  Diagnosis Date   Arthritis    Breast cancer ([place])    Diabetes mellitus without complication ([employer])    Diverticulitis    Hypertension     There are no problems to display for this patient.   Past Surgical History:  Procedure Laterality Date   ABDOMINAL HYSTERECTOMY     BREAST LUMPECTOMY     right   CHOLECYSTECTOMY       OB History   No obstetric history on file.     History reviewed. No pertinent family history.  Social History   Tobacco Use   Smoking status: Never Smoker   Smokeless tobacco: Never Used  Substance Use Topics   Alcohol use: Not Currently   Drug use: Not on file    Home Medications Prior to Admission medications   Medication Sig Start Date End Date Taking? Authorizing Provider  amoxicillin-clavulanate (AUGMENTIN) 875-125 MG tablet Take 1 tablet by mouth 2 (two) times daily for 10 days. 05/07/20 05/17/20  Lennice Sites, DO  fluticasone (FLONASE) 50 MCG/ACT nasal spray  04/18/20   [provider]  losartan-hydrochlorothiazide  (HYZAAR) 50-12.5 MG tablet Take 1 tablet by mouth daily. 02/26/20   [provider]  metFORMIN (GLUCOPHAGE-XR) 500 MG 24 hr tablet Take 500 mg by mouth 2 (two) times daily. 04/06/20   [provider]  metoprolol succinate (TOPROL-XL) 25 MG 24 hr tablet Take 50 mg by mouth daily. 04/15/20   [provider]  ondansetron (ZOFRAN) 4 MG tablet Take 1 tablet (4 mg total) by mouth every 8 (eight) hours as needed for up to 15 days for nausea or vomiting. 05/07/20 05/22/20  Lennice Sites, DO  spironolactone (ALDACTONE) 25 MG tablet Take 25 mg by mouth daily. 04/15/20   [provider]  tobramycin-dexamethasone [facility]) ophthalmic solution SMARTSIG:1 Drop(s) Right Eye Once 05/05/20   [provider]  traZODone (DESYREL) 150 MG tablet Take 150 mg by mouth at bedtime as needed. 04/20/20   [provider]  venlafaxine XR (EFFEXOR-XR) 150 MG 24 hr capsule Take 150 mg by mouth daily. 02/26/20   [provider]    Allergies    Patient has no allergy information on record.  Review of Systems   Review of Systems  Constitutional: Negative for chills and fever.  HENT: Negative for ear pain and sore throat.   Eyes: Negative for pain and visual disturbance.  Respiratory: Negative for cough and shortness of breath.   Cardiovascular: Negative for chest pain and palpitations.  Gastrointestinal: Positive for abdominal  pain, constipation and nausea. Negative for anorexia and vomiting.  Genitourinary: Negative for dysuria and hematuria.  Musculoskeletal: Negative for arthralgias and back pain.  Skin: Negative for color change and rash.  Neurological: Negative for seizures and syncope.  All other systems reviewed and are negative.   Physical Exam Updated Vital Signs  ED Triage Vitals  Enc Vitals Group     BP 05/07/20 1838 90/75     Pulse Rate 05/07/20 1838 92     Resp 05/07/20 1838 18     Temp 05/07/20 1838 98.3 F (36.8 C)     Temp Source 05/07/20 1838  Oral     SpO2 05/07/20 1838 97 %     Weight 05/07/20 1838 162 lb (73.5 kg)     Height 05/07/20 1838 5\' 4"  (1.626 m)     Head Circumference --      Peak Flow --      Pain Score 05/07/20 1852 0     Pain Loc --      Pain Edu? --      Excl. in [place]? --     Physical Exam Vitals and nursing note reviewed.  Constitutional:      General: She is not in acute distress.    Appearance: She is well-developed.  HENT:     Head: Normocephalic and atraumatic.  Eyes:     Extraocular Movements: Extraocular movements intact.     Conjunctiva/sclera: Conjunctivae normal.  Cardiovascular:     Rate and Rhythm: Normal rate and regular rhythm.     Heart sounds: Normal heart sounds. No murmur heard.   Pulmonary:     Effort: Pulmonary effort is normal. No respiratory distress.     Breath sounds: Normal breath sounds.  Abdominal:     General: There is distension.     Palpations: Abdomen is soft.     Tenderness: There is abdominal tenderness in the left lower quadrant. There is no guarding or rebound.  Musculoskeletal:     Cervical back: Neck supple.  Skin:    General: Skin is warm and dry.  Neurological:     Mental Status: She is alert.     ED Results / Procedures / Treatments   Labs (all labs ordered are listed, but only abnormal results are displayed) Labs Reviewed  COMPREHENSIVE METABOLIC PANEL - Abnormal; Notable for the following components:      Result Value   Sodium 133 (*)    Chloride 97 (*)    Glucose, Bld 131 (*)    All other components within normal limits  CBC WITH DIFFERENTIAL/PLATELET - Abnormal; Notable for the following components:   WBC 10.6 (*)    All other components within normal limits  URINALYSIS, ROUTINE W REFLEX MICROSCOPIC - Abnormal; Notable for the following components:   Hgb urine dipstick SMALL (*)    All other components within normal limits  URINALYSIS, MICROSCOPIC (REFLEX) - Abnormal; Notable for the following components:   Bacteria, UA RARE (*)    All  other components within normal limits  LIPASE, BLOOD    EKG None  Radiology CT ABDOMEN PELVIS W CONTRAST  Result Date: 05/07/2020 CLINICAL DATA:  Left lower quadrant pain radiating superiorly, concern for diverticulitis EXAM: CT ABDOMEN AND PELVIS WITH CONTRAST TECHNIQUE: Multidetector CT imaging of the abdomen and pelvis was performed using the standard protocol following bolus administration of intravenous contrast. CONTRAST:  113mL OMNIPAQUE IOHEXOL 300 MG/ML  SOLN COMPARISON:  CT 03/31/2016 FINDINGS: Lower chest: Mild bandlike opacities in the  lung bases likely reflecting some subsegmental atelectasis or scarring within the otherwise clear lung bases. Cardiac size at the upper limits of normal. Dense mitral annular calcifications. Few calcifications on the aortic leaflets as well. Scant coronary calcification is present. No pericardial effusion. Likely benign macrocalcifications again seen in the right breast tissues, correlate with recent mammography. Hepatobiliary: No worrisome focal liver lesions. Smooth liver surface contour. Normal hepatic attenuation. Post cholecystectomy. Mild prominence of the biliary tree similar to minimally increased from prior and could reflect a combination of post cholecystectomy reservoir effect and senescent change without visible calcified intraductal gallstones identified. Pancreas: Unremarkable. No pancreatic ductal dilatation or surrounding inflammatory changes. Spleen: Normal in size. No concerning splenic lesions. Adrenals/Urinary Tract: Normal adrenal glands. Kidneys enhance and excrete symmetrically. Scattered subcentimeter hypoattenuating foci in both kidneys too small to fully characterize on CT imaging but statistically likely benign. No concerning renal lesions. No obstructive urolithiasis or hydronephrosis. Stable mild bilateral perinephric stranding, nonspecific. Redemonstration of several punctate calcifications near the bladder base/urethra which are  unchanged from comparison exam. Stomach/Bowel: Distal thoracic esophagus and stomach are unremarkable. Small air-filled duodenal diverticulum again noted, not significantly changed from prior without focal inflammation. Duodenum takes a normal course across the midline abdomen. No small bowel thickening or dilatation. A normal appendix is visualized. No proximal colonic thickening or dilatation. Scattered distal colonic diverticula with a region of focal segmental thickening of the distal descending colon which appears centered upon a culprit diverticulum (4/48, 2/50). No extraluminal gas, significant free fluid, organized collection or abscess is seen. Adjacent phlegmonous changes with some mild reactive peritoneal thickening and enhancement. More distal rectosigmoid with numerous colonic diverticula but no other sites of focal inflammation. Vascular/Lymphatic: Atherosclerotic calcifications within the abdominal aorta and branch vessels. No aneurysm or ectasia. No enlarged abdominopelvic lymph nodes. Reproductive: Uterus is surgically absent. No concerning adnexal lesions. Other: No abdominopelvic free fluid or free gas. Inflammation/phlegmon in the left lower quadrant centered upon the distal descending colon, as above. No bowel containing hernias. Small fat containing umbilical hernia. Musculoskeletal: Multilevel degenerative changes are present in the imaged portions of the spine. Mild grade 1 anterolisthesis L4 on L5 with bony fusion of the L4-S1 articular facets. No spondylolysis. 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 Mild degenerative changes in the hips and pelvis as well. 1 minutes in no acute or worrisome osseous lesions. IMPRESSION: 1. Acute, uncomplicated diverticulitis of the distal descending colon with surrounding phlegmon and mild localized reactive peritonitis. No evidence of perforation or abscess formation. 2. 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 3. Macro calcifications in the right breast tissues, almost certainly benign and may relate to prior postsurgical changes with benign mammography 1 month prior 4. Post hysterectomy, cholecystectomy. 5. Mild prominence of the biliary tree, likely reflect a combination of post cholecystectomy reservoir effect and senescent change without visible calcified intraductal gallstones identified. 6. Aortic Atherosclerosis (ICD10-I70.0). Electronically Signed   By: Lovena Le M.D.   On: 05/07/2020 23:17    Procedures Procedures (including critical care time)  Medications Ordered in ED Medications  amoxicillin-clavulanate (AUGMENTIN) 875-125 MG per tablet 1 tablet (has no administration in time range)  sodium chloride 0.9 % bolus 1,000 mL (1,000 mLs Intravenous New Bag/Given 05/07/20 2222)  fentaNYL (SUBLIMAZE) injection 50 mcg (50 mcg Intravenous Given 05/07/20 2223)  ondansetron (ZOFRAN) injection 4 mg (4 mg Intravenous Given 05/07/20 2222)  iohexol (OMNIPAQUE) 300 MG/ML  solution 100 mL (100 mLs Intravenous Contrast Given 05/07/20 2252)    ED Course  I have reviewed the triage vital signs and the nursing notes.  Pertinent labs & imaging results that were available during my care of the patient were reviewed by me and considered in my medical decision making (see chart for details).    MDM Rules/Calculators/A&P                          Tammy Gilbert is a 74 year old female with history of diverticulitis who presents to the ED with left lower abdominal pain.  Normal vitals.  No fever.  Pain for the last several weeks.  Some constipation.  Tenderness most in the left lower quadrant.  Mild leukocytosis but otherwise no significant anemia, electrolyte abnormality, kidney injury.  This is suspicious for UTI versus diverticulitis.  CT scan shows acute diverticulitis,  overall uncomplicated.  No abscess, no perforation.  Urinalysis negative for infection.  Patient given Augmentin.  Felt better after IV fluids, Zofran, fentanyl.  Will discharge home with antibiotics and Zofran.  Understands return precautions, discharged in good condition.  This chart was dictated using voice recognition software.  Despite best efforts to proofread,  errors can occur which can change the documentation meaning.    Final Clinical Impression(s) / ED Diagnoses Final diagnoses:  Acute diverticulitis    Rx / DC Orders ED Discharge Orders         Ordered    amoxicillin-clavulanate (AUGMENTIN) 875-125 MG tablet  2 times daily     Discontinue  Reprint     05/07/20 2326    ondansetron (ZOFRAN) 4 MG tablet  Every 8 hours PRN     Discontinue  Reprint     05/07/20 2326           Lennice Sites, DO 05/07/20 2326

## 2020-06-08 DIAGNOSIS — H0100B Unspecified blepharitis left eye, upper and lower eyelids: Secondary | ICD-10-CM | POA: Diagnosis not present

## 2020-06-08 DIAGNOSIS — H0100A Unspecified blepharitis right eye, upper and lower eyelids: Secondary | ICD-10-CM | POA: Diagnosis not present

## 2020-06-08 DIAGNOSIS — D23111 Other benign neoplasm of skin of right upper eyelid, including canthus: Secondary | ICD-10-CM | POA: Diagnosis not present

## 2020-06-08 DIAGNOSIS — D22111 Melanocytic nevi of right upper eyelid, including canthus: Secondary | ICD-10-CM | POA: Diagnosis not present

## 2020-06-24 ENCOUNTER — Encounter: Payer: Self-pay | Admitting: Skilled Nursing Facility1

## 2020-06-24 ENCOUNTER — Other Ambulatory Visit: Payer: Self-pay

## 2020-06-24 ENCOUNTER — Encounter: Payer: PPO | Attending: Family Medicine | Admitting: Skilled Nursing Facility1

## 2020-06-24 DIAGNOSIS — E119 Type 2 diabetes mellitus without complications: Secondary | ICD-10-CM | POA: Diagnosis not present

## 2020-06-24 NOTE — Progress Notes (Signed)
°  Assessment:  Primary concerns today: no appetite.   Pt states she does have a history of kidney stones. Pt states she has no appetite. Pt states she no longer likes the taste of vegetables. Pt state she does not know what to eat with diverticulitis. Pt states she had a flare up about 1 month ago. Pt states she feels bloated all the time stating she needs to take an antacid daily. Pt states she drinks kefir and uses benefiber and takes a probiotic. Pt states she used to camp and used to tend her garden. Pt states grapes and fresh pineapples make her tongue feel weird. Pt states she checks her blood sugars 2-3 times a week: sometimes 200 sometimes 120-140 Pt states she never skips a meal.  Pt states she has had less of an appetite for about 5 years and has gotten worse with the passing of her husband a year ago stating food just doe snot excite her anymore.    MEDICATIONS: see list   DIETARY INTAKE:  Usual eating pattern includes 3 meals and 1 snacks per day.  Everyday foods include none stated.  Avoided foods include see above.    24-hr recall:  B ( AM): greek yogurt + dates or blueberries or egg mc muffin or bacon and eggs Snk ( AM):  L ( PM): hamburger + fries Snk ( PM):  D ( PM): cherry tomatoes + grilled cheese cooked in butter Snk ( PM):  Beverages: water, soda, 2% milk, coffee half caff + powder creamer   Usual physical activity:   Estimated energy needs: 1600 calories  Progress Towards Goal(s):  In progress.   Nutritional Diagnosis:  NB-1.1 Food and nutrition-related knowledge deficit As related to newly diagnosed diverticulitis.  As evidenced by referral and pt statement .    Intervention:  Nutrition counseling. Dietitian educated pt on controlling diabetes through appropriate diet inclusive of fruits and other complex carbohydrates.  Why you need complex carbohydrates: Whole grains and other complex carbohydrates are required to have a healthy diet. Whole grains provide  fiber which can help with blood glucose levels and help keep you satiated. Fruits and starchy vegetables provide essential vitamins and minerals required for immune function, eyesight support, brain support, bone density, wound healing and many other functions within the body. According to the current evidenced based 2020-2025 Dietary Guidelines for Americans, complex carbohydrates are part of a healthy eating pattern which is associated with a decreased risk for type 2 diabetes, cancers, and cardiovascular disease.    Goals:  Try [place] water pH 8.8 It is okay to eat fruit: 3/4 of a cup of berries with your yogurt and 1 apple + peanut butter or cheese later in the day is a great option Aim to eat soft cooked non starchy vegetables 2 times a day 7 days a week; try adding a sauce you like like lower sodium soy sauce or teriyaki  Take your antacid daily  Aim to drink 64 ounces of water daily: add 2 ounces of lemon juice 2 times in the day (stop if burns) Take a calcium citrate supplement daily   Teaching Method Utilized:  Visual Auditory Hands on  Handouts given during visit include:  Detailed MyPlate  Diabetes Education Book  Barriers to learning/adherence to lifestyle change: none identified   Demonstrated degree of understanding via:  Teach Back   Monitoring/Evaluation:  Dietary intake, exercise, and body weight prn.

## 2020-07-30 DIAGNOSIS — S90861A Insect bite (nonvenomous), right foot, initial encounter: Secondary | ICD-10-CM | POA: Diagnosis not present

## 2020-07-30 DIAGNOSIS — L03115 Cellulitis of right lower limb: Secondary | ICD-10-CM | POA: Diagnosis not present

## 2020-07-30 DIAGNOSIS — Z8639 Personal history of other endocrine, nutritional and metabolic disease: Secondary | ICD-10-CM | POA: Diagnosis not present

## 2020-08-04 IMAGING — CT CT ABD-PELV W/ CM
2 of 5 series · 14 of 46 positions shown, 16 images · IV contrast (Omnipaque)
Comparison: CT 03/31/2016

CLINICAL DATA: Left lower quadrant pain radiating superiorly,
concern for diverticulitis

EXAM:
CT ABDOMEN AND PELVIS WITH CONTRAST
TECHNIQUE: Multidetector CT imaging of the abdomen and pelvis was performed
using the standard protocol following bolus administration of
intravenous contrast.
CONTRAST:  100mL OMNIPAQUE IOHEXOL 300 MG/ML  SOLN

[Series 2: axial st · axial · 0.75mm/px · z∈[-367,+18]mm · 11 of 87 slices shown, 13 images]
[im 5/87  soft-tissue]
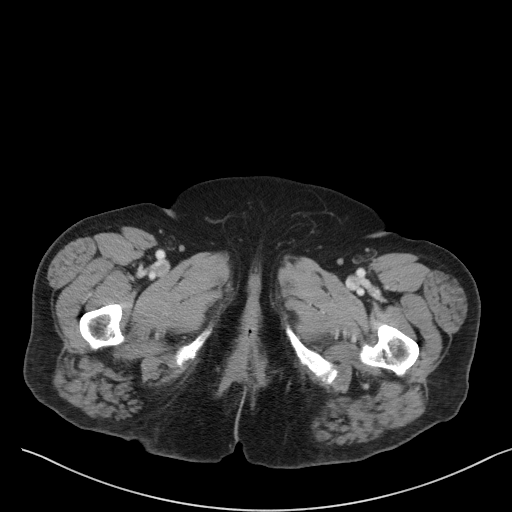
[im 5/87  bone]
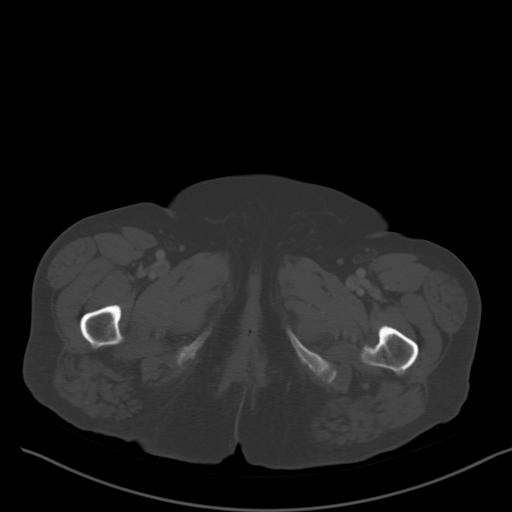
[im 14/87  soft-tissue]
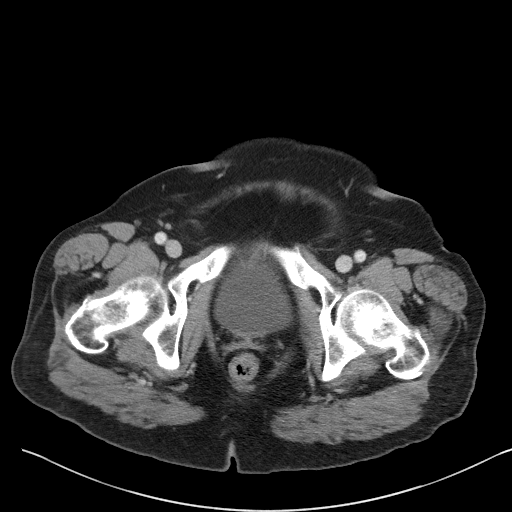
[im 23/87  soft-tissue]
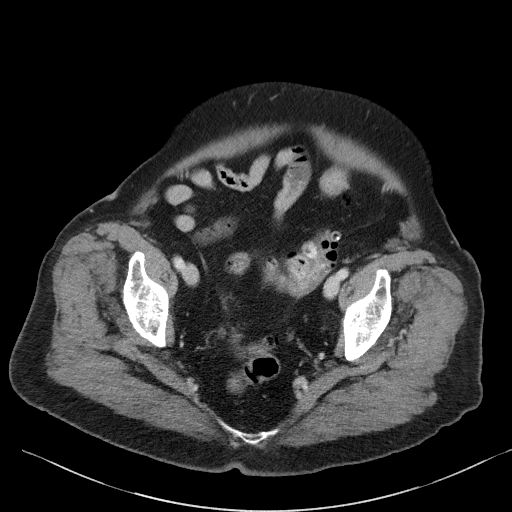
[im 28/87  soft-tissue]
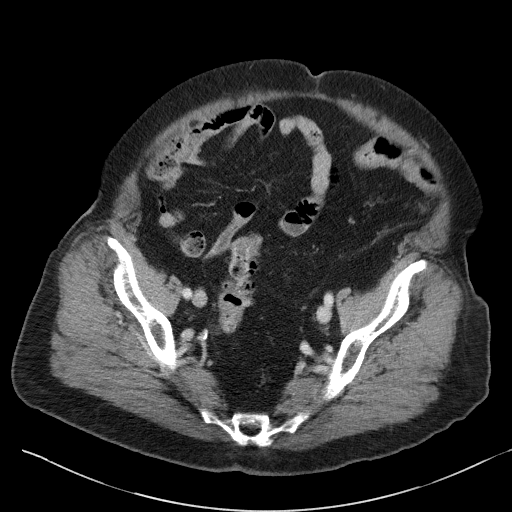
[im 37/87  soft-tissue]
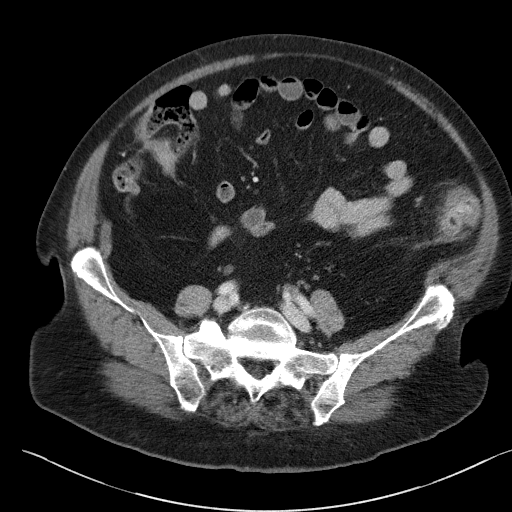
[im 46/87  soft-tissue]
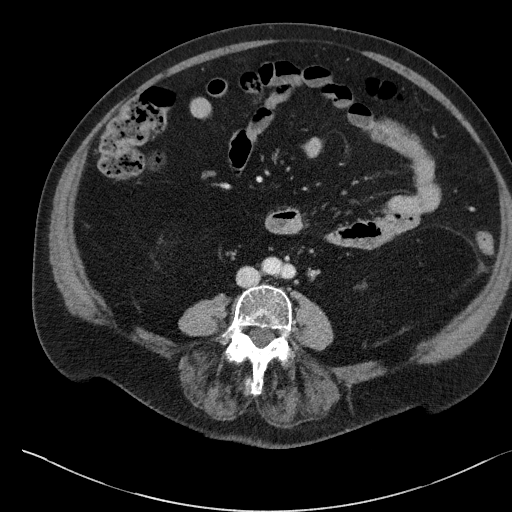
[im 50/87  soft-tissue]
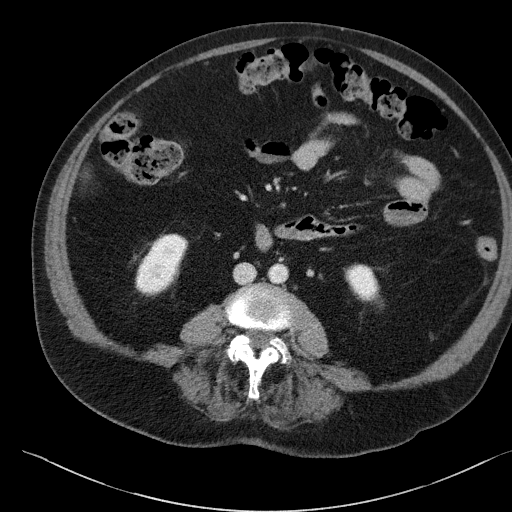
[im 59/87  soft-tissue]
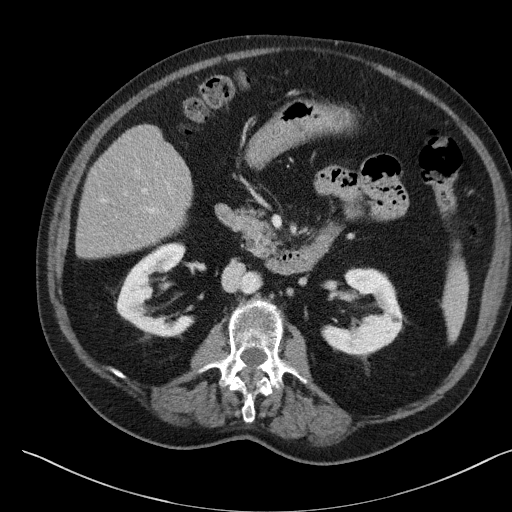
[im 64/87  soft-tissue]
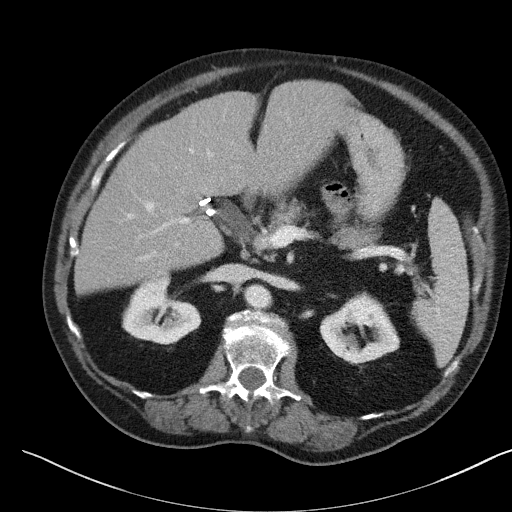
[im 64/87  bone]
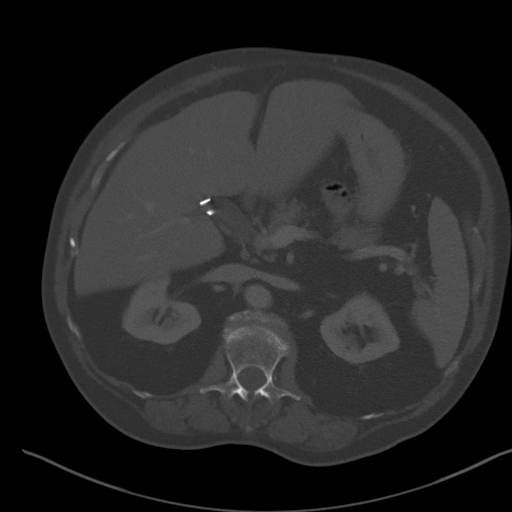
[im 73/87  soft-tissue]
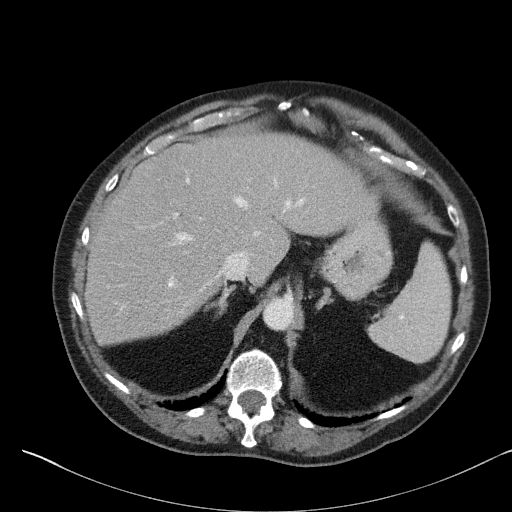
[im 82/87  soft-tissue]
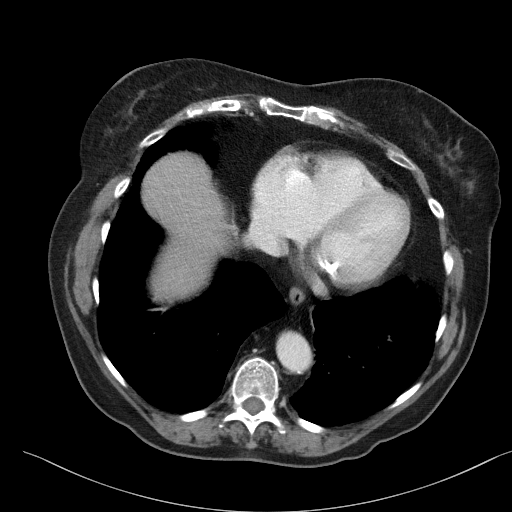

[Series 5: coronal st · coronal · 0.86mm/px · 3 of 112 slices shown]
[im 38/112  soft-tissue]
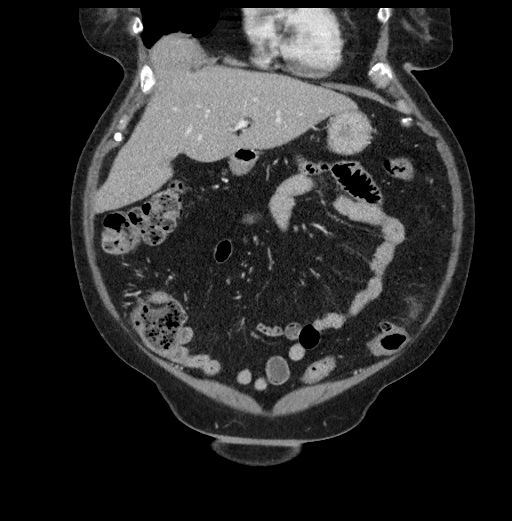
[im 50/112  soft-tissue]
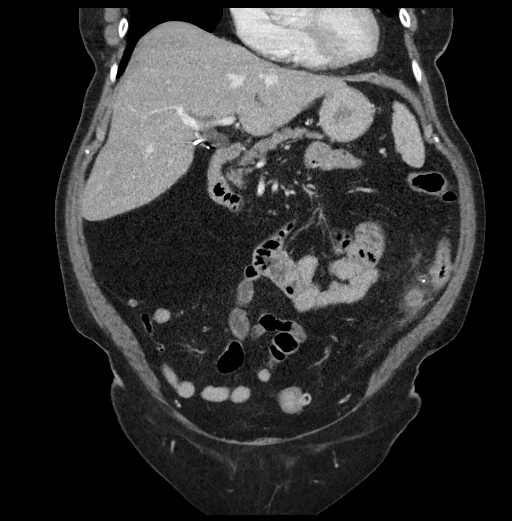
[im 62/112  soft-tissue]
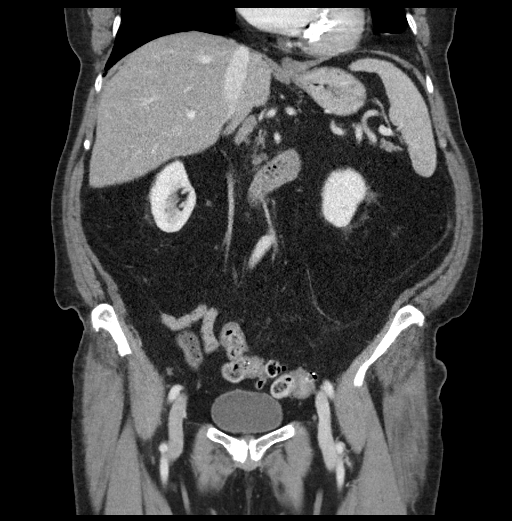

[14 of 46 positions shown; findings below may reference images not displayed]

FINDINGS: Lower chest: Mild bandlike opacities in the lung bases likely
reflecting some subsegmental atelectasis or scarring within the
otherwise clear lung bases. Cardiac size at the upper limits of
normal. Dense mitral annular calcifications. Few calcifications on
the aortic leaflets as well. Scant coronary calcification is
present. No pericardial effusion. Likely benign macrocalcifications
again seen in the right breast tissues, correlate with recent
mammography.

Hepatobiliary: No worrisome focal liver lesions. Smooth liver
surface contour. Normal hepatic attenuation. Post cholecystectomy.
Mild prominence of the biliary tree similar to minimally increased
from prior and could reflect a combination of post cholecystectomy
reservoir effect and senescent change without visible calcified
intraductal gallstones identified.

Pancreas: Unremarkable. No pancreatic ductal dilatation or
surrounding inflammatory changes.

Spleen: Normal in size. No concerning splenic lesions.

Adrenals/Urinary Tract: Normal adrenal glands. Kidneys enhance and
excrete symmetrically. Scattered subcentimeter hypoattenuating foci
in both kidneys too small to fully characterize on CT imaging but
statistically likely benign. No concerning renal lesions. No
obstructive urolithiasis or hydronephrosis. Stable mild bilateral
perinephric stranding, nonspecific. Redemonstration of several
punctate calcifications near the bladder base/urethra which are
unchanged from comparison exam.

Stomach/Bowel: Distal thoracic esophagus and stomach are
unremarkable. Small air-filled duodenal diverticulum again noted,
not significantly changed from prior without focal inflammation.
Duodenum takes a normal course across the midline abdomen. No small
bowel thickening or dilatation. A normal appendix is visualized. No
proximal colonic thickening or dilatation. Scattered distal colonic
diverticula with a region of focal segmental thickening of the
distal descending colon which appears centered upon a culprit
diverticulum (4/48, 2/50). No extraluminal gas, significant free
fluid, organized collection or abscess is seen. Adjacent phlegmonous
changes with some mild reactive peritoneal thickening and
enhancement. More distal rectosigmoid with numerous colonic
diverticula but no other sites of focal inflammation.

Vascular/Lymphatic: Atherosclerotic calcifications within the
abdominal aorta and branch vessels. No aneurysm or ectasia. No
enlarged abdominopelvic lymph nodes.

Reproductive: Uterus is surgically absent. No concerning adnexal
lesions.

Other: No abdominopelvic free fluid or free gas.
Inflammation/phlegmon in the left lower quadrant centered upon the
distal descending colon, as above. No bowel containing hernias.
Small fat containing umbilical hernia.

Musculoskeletal: Multilevel degenerative changes are present in the
imaged portions of the spine. Mild grade 1 anterolisthesis L4 on L5
with bony fusion of the L4-S1 articular facets. No spondylolysis.
Diffuse interspinous arthrosis L2-L5 compatible with Baastrup's
disease. Mild degenerative changes in the hips and pelvis as well. 1
minutes in no acute or worrisome osseous lesions.
IMPRESSION: 1. Acute, uncomplicated diverticulitis of the distal descending
colon with surrounding phlegmon and mild localized reactive
peritonitis. No evidence of perforation or abscess formation.
2. Punctate calcifications near the bladder base/urethra, unchanged
from comparison exam, could reflect calcifications within a urethral
diverticulum given stability since [DATE]. Macro calcifications in the right breast tissues, almost
certainly benign and may relate to prior postsurgical changes with
benign mammography 1 month prior
4. Post hysterectomy, cholecystectomy.
5. Mild prominence of the biliary tree, likely reflect a combination
of post cholecystectomy reservoir effect and senescent change
without visible calcified intraductal gallstones identified.
6. Aortic Atherosclerosis ([id number]-M06.6).

## 2020-08-05 DIAGNOSIS — Z23 Encounter for immunization: Secondary | ICD-10-CM | POA: Diagnosis not present

## 2020-08-05 DIAGNOSIS — T63424D Toxic effect of venom of ants, undetermined, subsequent encounter: Secondary | ICD-10-CM | POA: Diagnosis not present

## 2020-08-17 DIAGNOSIS — L570 Actinic keratosis: Secondary | ICD-10-CM | POA: Diagnosis not present

## 2020-09-21 DIAGNOSIS — Z7984 Long term (current) use of oral hypoglycemic drugs: Secondary | ICD-10-CM | POA: Diagnosis not present

## 2020-09-21 DIAGNOSIS — H0100B Unspecified blepharitis left eye, upper and lower eyelids: Secondary | ICD-10-CM | POA: Diagnosis not present

## 2020-09-21 DIAGNOSIS — E119 Type 2 diabetes mellitus without complications: Secondary | ICD-10-CM | POA: Diagnosis not present

## 2020-09-21 DIAGNOSIS — H524 Presbyopia: Secondary | ICD-10-CM | POA: Diagnosis not present

## 2020-09-21 DIAGNOSIS — H43393 Other vitreous opacities, bilateral: Secondary | ICD-10-CM | POA: Diagnosis not present

## 2020-09-21 DIAGNOSIS — H5203 Hypermetropia, bilateral: Secondary | ICD-10-CM | POA: Diagnosis not present

## 2020-09-21 DIAGNOSIS — H2513 Age-related nuclear cataract, bilateral: Secondary | ICD-10-CM | POA: Diagnosis not present

## 2020-09-21 DIAGNOSIS — H16223 Keratoconjunctivitis sicca, not specified as Sjogren's, bilateral: Secondary | ICD-10-CM | POA: Diagnosis not present

## 2020-09-21 DIAGNOSIS — D22112 Melanocytic nevi of right lower eyelid, including canthus: Secondary | ICD-10-CM | POA: Diagnosis not present

## 2020-09-21 DIAGNOSIS — D23111 Other benign neoplasm of skin of right upper eyelid, including canthus: Secondary | ICD-10-CM | POA: Diagnosis not present

## 2020-09-21 DIAGNOSIS — H40013 Open angle with borderline findings, low risk, bilateral: Secondary | ICD-10-CM | POA: Diagnosis not present

## 2020-09-21 DIAGNOSIS — H0100A Unspecified blepharitis right eye, upper and lower eyelids: Secondary | ICD-10-CM | POA: Diagnosis not present

## 2020-11-17 DIAGNOSIS — I10 Essential (primary) hypertension: Secondary | ICD-10-CM | POA: Diagnosis not present

## 2020-11-17 DIAGNOSIS — E7800 Pure hypercholesterolemia, unspecified: Secondary | ICD-10-CM | POA: Diagnosis not present

## 2020-11-17 DIAGNOSIS — E119 Type 2 diabetes mellitus without complications: Secondary | ICD-10-CM | POA: Diagnosis not present

## 2020-11-17 DIAGNOSIS — F419 Anxiety disorder, unspecified: Secondary | ICD-10-CM | POA: Diagnosis not present

## 2020-11-17 DIAGNOSIS — Z1389 Encounter for screening for other disorder: Secondary | ICD-10-CM | POA: Diagnosis not present

## 2020-11-17 DIAGNOSIS — Z0000 Encounter for general adult medical examination without abnormal findings: Secondary | ICD-10-CM | POA: Diagnosis not present

## 2020-11-25 DIAGNOSIS — F329 Major depressive disorder, single episode, unspecified: Secondary | ICD-10-CM | POA: Diagnosis not present

## 2020-11-25 DIAGNOSIS — M25571 Pain in right ankle and joints of right foot: Secondary | ICD-10-CM | POA: Diagnosis not present

## 2020-11-25 DIAGNOSIS — M25572 Pain in left ankle and joints of left foot: Secondary | ICD-10-CM | POA: Diagnosis not present

## 2020-11-25 DIAGNOSIS — E1169 Type 2 diabetes mellitus with other specified complication: Secondary | ICD-10-CM | POA: Diagnosis not present

## 2020-11-25 DIAGNOSIS — F419 Anxiety disorder, unspecified: Secondary | ICD-10-CM | POA: Diagnosis not present

## 2020-11-25 DIAGNOSIS — I10 Essential (primary) hypertension: Secondary | ICD-10-CM | POA: Diagnosis not present

## 2020-11-25 DIAGNOSIS — E7800 Pure hypercholesterolemia, unspecified: Secondary | ICD-10-CM | POA: Diagnosis not present

## 2020-12-30 DIAGNOSIS — L84 Corns and callosities: Secondary | ICD-10-CM | POA: Diagnosis not present

## 2020-12-30 DIAGNOSIS — E119 Type 2 diabetes mellitus without complications: Secondary | ICD-10-CM | POA: Diagnosis not present

## 2021-02-22 DIAGNOSIS — H16223 Keratoconjunctivitis sicca, not specified as Sjogren's, bilateral: Secondary | ICD-10-CM | POA: Diagnosis not present

## 2021-02-22 DIAGNOSIS — H0100A Unspecified blepharitis right eye, upper and lower eyelids: Secondary | ICD-10-CM | POA: Diagnosis not present

## 2021-02-22 DIAGNOSIS — H0012 Chalazion right lower eyelid: Secondary | ICD-10-CM | POA: Diagnosis not present

## 2021-02-22 DIAGNOSIS — H0100B Unspecified blepharitis left eye, upper and lower eyelids: Secondary | ICD-10-CM | POA: Diagnosis not present

## 2021-02-22 DIAGNOSIS — D22112 Melanocytic nevi of right lower eyelid, including canthus: Secondary | ICD-10-CM | POA: Diagnosis not present

## 2021-03-24 DIAGNOSIS — H0100A Unspecified blepharitis right eye, upper and lower eyelids: Secondary | ICD-10-CM | POA: Diagnosis not present

## 2021-03-24 DIAGNOSIS — H0100B Unspecified blepharitis left eye, upper and lower eyelids: Secondary | ICD-10-CM | POA: Diagnosis not present

## 2021-03-24 DIAGNOSIS — H40013 Open angle with borderline findings, low risk, bilateral: Secondary | ICD-10-CM | POA: Diagnosis not present

## 2021-04-11 DIAGNOSIS — Z1231 Encounter for screening mammogram for malignant neoplasm of breast: Secondary | ICD-10-CM | POA: Diagnosis not present

## 2021-04-29 DIAGNOSIS — Z08 Encounter for follow-up examination after completed treatment for malignant neoplasm: Secondary | ICD-10-CM | POA: Diagnosis not present

## 2021-04-29 DIAGNOSIS — Z9011 Acquired absence of right breast and nipple: Secondary | ICD-10-CM | POA: Diagnosis not present

## 2021-04-29 DIAGNOSIS — D0511 Intraductal carcinoma in situ of right breast: Secondary | ICD-10-CM | POA: Diagnosis not present

## 2021-04-29 DIAGNOSIS — Z634 Disappearance and death of family member: Secondary | ICD-10-CM | POA: Diagnosis not present

## 2021-04-29 DIAGNOSIS — Z86000 Personal history of in-situ neoplasm of breast: Secondary | ICD-10-CM | POA: Diagnosis not present

## 2021-05-19 DIAGNOSIS — N390 Urinary tract infection, site not specified: Secondary | ICD-10-CM | POA: Diagnosis not present

## 2021-06-09 DIAGNOSIS — F329 Major depressive disorder, single episode, unspecified: Secondary | ICD-10-CM | POA: Diagnosis not present

## 2021-06-09 DIAGNOSIS — I10 Essential (primary) hypertension: Secondary | ICD-10-CM | POA: Diagnosis not present

## 2021-06-09 DIAGNOSIS — F419 Anxiety disorder, unspecified: Secondary | ICD-10-CM | POA: Diagnosis not present

## 2021-06-09 DIAGNOSIS — E7800 Pure hypercholesterolemia, unspecified: Secondary | ICD-10-CM | POA: Diagnosis not present

## 2021-06-09 DIAGNOSIS — Z1211 Encounter for screening for malignant neoplasm of colon: Secondary | ICD-10-CM | POA: Diagnosis not present

## 2021-06-09 DIAGNOSIS — R61 Generalized hyperhidrosis: Secondary | ICD-10-CM | POA: Diagnosis not present

## 2021-06-09 DIAGNOSIS — E1169 Type 2 diabetes mellitus with other specified complication: Secondary | ICD-10-CM | POA: Diagnosis not present

## 2021-06-13 DIAGNOSIS — B356 Tinea cruris: Secondary | ICD-10-CM | POA: Diagnosis not present

## 2021-06-13 DIAGNOSIS — Z87440 Personal history of urinary (tract) infections: Secondary | ICD-10-CM | POA: Diagnosis not present

## 2021-08-24 DIAGNOSIS — I781 Nevus, non-neoplastic: Secondary | ICD-10-CM | POA: Diagnosis not present

## 2021-08-24 DIAGNOSIS — D485 Neoplasm of uncertain behavior of skin: Secondary | ICD-10-CM | POA: Diagnosis not present

## 2021-08-24 DIAGNOSIS — L821 Other seborrheic keratosis: Secondary | ICD-10-CM | POA: Diagnosis not present

## 2021-08-24 DIAGNOSIS — C44319 Basal cell carcinoma of skin of other parts of face: Secondary | ICD-10-CM | POA: Diagnosis not present

## 2021-09-07 DIAGNOSIS — N390 Urinary tract infection, site not specified: Secondary | ICD-10-CM | POA: Diagnosis not present

## 2021-10-03 DIAGNOSIS — H40013 Open angle with borderline findings, low risk, bilateral: Secondary | ICD-10-CM | POA: Diagnosis not present

## 2021-10-03 DIAGNOSIS — H2513 Age-related nuclear cataract, bilateral: Secondary | ICD-10-CM | POA: Diagnosis not present

## 2021-10-03 DIAGNOSIS — H43393 Other vitreous opacities, bilateral: Secondary | ICD-10-CM | POA: Diagnosis not present

## 2021-10-03 DIAGNOSIS — Z7984 Long term (current) use of oral hypoglycemic drugs: Secondary | ICD-10-CM | POA: Diagnosis not present

## 2021-10-03 DIAGNOSIS — H0100A Unspecified blepharitis right eye, upper and lower eyelids: Secondary | ICD-10-CM | POA: Diagnosis not present

## 2021-10-03 DIAGNOSIS — E119 Type 2 diabetes mellitus without complications: Secondary | ICD-10-CM | POA: Diagnosis not present

## 2021-10-03 DIAGNOSIS — H5203 Hypermetropia, bilateral: Secondary | ICD-10-CM | POA: Diagnosis not present

## 2021-10-03 DIAGNOSIS — D22112 Melanocytic nevi of right lower eyelid, including canthus: Secondary | ICD-10-CM | POA: Diagnosis not present

## 2021-10-03 DIAGNOSIS — H16223 Keratoconjunctivitis sicca, not specified as Sjogren's, bilateral: Secondary | ICD-10-CM | POA: Diagnosis not present

## 2021-10-03 DIAGNOSIS — H52203 Unspecified astigmatism, bilateral: Secondary | ICD-10-CM | POA: Diagnosis not present

## 2021-10-03 DIAGNOSIS — H524 Presbyopia: Secondary | ICD-10-CM | POA: Diagnosis not present

## 2021-10-03 DIAGNOSIS — H0100B Unspecified blepharitis left eye, upper and lower eyelids: Secondary | ICD-10-CM | POA: Diagnosis not present

## 2021-10-05 DIAGNOSIS — R1030 Lower abdominal pain, unspecified: Secondary | ICD-10-CM | POA: Diagnosis not present

## 2021-10-26 DIAGNOSIS — C44319 Basal cell carcinoma of skin of other parts of face: Secondary | ICD-10-CM | POA: Diagnosis not present

## 2021-11-02 DIAGNOSIS — L905 Scar conditions and fibrosis of skin: Secondary | ICD-10-CM | POA: Diagnosis not present

## 2021-11-07 DIAGNOSIS — R300 Dysuria: Secondary | ICD-10-CM | POA: Diagnosis not present

## 2021-11-07 DIAGNOSIS — N952 Postmenopausal atrophic vaginitis: Secondary | ICD-10-CM | POA: Diagnosis not present

## 2021-11-23 DIAGNOSIS — Z1389 Encounter for screening for other disorder: Secondary | ICD-10-CM | POA: Diagnosis not present

## 2021-11-23 DIAGNOSIS — Z0000 Encounter for general adult medical examination without abnormal findings: Secondary | ICD-10-CM | POA: Diagnosis not present

## 2021-12-02 DIAGNOSIS — H6123 Impacted cerumen, bilateral: Secondary | ICD-10-CM | POA: Diagnosis not present

## 2021-12-02 DIAGNOSIS — H903 Sensorineural hearing loss, bilateral: Secondary | ICD-10-CM | POA: Diagnosis not present

## 2021-12-02 DIAGNOSIS — Z9109 Other allergy status, other than to drugs and biological substances: Secondary | ICD-10-CM | POA: Diagnosis not present

## 2021-12-02 DIAGNOSIS — H938X3 Other specified disorders of ear, bilateral: Secondary | ICD-10-CM | POA: Diagnosis not present

## 2021-12-02 DIAGNOSIS — R0981 Nasal congestion: Secondary | ICD-10-CM | POA: Diagnosis not present

## 2021-12-16 DIAGNOSIS — I10 Essential (primary) hypertension: Secondary | ICD-10-CM | POA: Diagnosis not present

## 2021-12-16 DIAGNOSIS — N952 Postmenopausal atrophic vaginitis: Secondary | ICD-10-CM | POA: Diagnosis not present

## 2021-12-16 DIAGNOSIS — E1169 Type 2 diabetes mellitus with other specified complication: Secondary | ICD-10-CM | POA: Diagnosis not present

## 2021-12-16 DIAGNOSIS — F419 Anxiety disorder, unspecified: Secondary | ICD-10-CM | POA: Diagnosis not present

## 2021-12-16 DIAGNOSIS — F329 Major depressive disorder, single episode, unspecified: Secondary | ICD-10-CM | POA: Diagnosis not present

## 2021-12-16 DIAGNOSIS — E7800 Pure hypercholesterolemia, unspecified: Secondary | ICD-10-CM | POA: Diagnosis not present

## 2021-12-16 DIAGNOSIS — M158 Other polyosteoarthritis: Secondary | ICD-10-CM | POA: Diagnosis not present

## 2022-01-02 DIAGNOSIS — D1801 Hemangioma of skin and subcutaneous tissue: Secondary | ICD-10-CM | POA: Diagnosis not present

## 2022-01-02 DIAGNOSIS — L218 Other seborrheic dermatitis: Secondary | ICD-10-CM | POA: Diagnosis not present

## 2022-01-02 DIAGNOSIS — X32XXXS Exposure to sunlight, sequela: Secondary | ICD-10-CM | POA: Diagnosis not present

## 2022-01-02 DIAGNOSIS — Z85828 Personal history of other malignant neoplasm of skin: Secondary | ICD-10-CM | POA: Diagnosis not present

## 2022-01-02 DIAGNOSIS — L821 Other seborrheic keratosis: Secondary | ICD-10-CM | POA: Diagnosis not present

## 2022-01-02 DIAGNOSIS — L570 Actinic keratosis: Secondary | ICD-10-CM | POA: Diagnosis not present

## 2022-01-02 DIAGNOSIS — L814 Other melanin hyperpigmentation: Secondary | ICD-10-CM | POA: Diagnosis not present

## 2022-02-16 DIAGNOSIS — E119 Type 2 diabetes mellitus without complications: Secondary | ICD-10-CM | POA: Diagnosis not present

## 2022-02-16 DIAGNOSIS — L03031 Cellulitis of right toe: Secondary | ICD-10-CM | POA: Diagnosis not present

## 2022-02-16 DIAGNOSIS — L84 Corns and callosities: Secondary | ICD-10-CM | POA: Diagnosis not present

## 2022-04-03 DIAGNOSIS — H40013 Open angle with borderline findings, low risk, bilateral: Secondary | ICD-10-CM | POA: Diagnosis not present

## 2022-04-03 DIAGNOSIS — H04123 Dry eye syndrome of bilateral lacrimal glands: Secondary | ICD-10-CM | POA: Diagnosis not present

## 2022-05-01 DIAGNOSIS — Z1231 Encounter for screening mammogram for malignant neoplasm of breast: Secondary | ICD-10-CM | POA: Diagnosis not present

## 2022-06-22 DIAGNOSIS — Z853 Personal history of malignant neoplasm of breast: Secondary | ICD-10-CM | POA: Diagnosis not present

## 2022-06-22 DIAGNOSIS — N952 Postmenopausal atrophic vaginitis: Secondary | ICD-10-CM | POA: Diagnosis not present

## 2022-06-22 DIAGNOSIS — I10 Essential (primary) hypertension: Secondary | ICD-10-CM | POA: Diagnosis not present

## 2022-06-22 DIAGNOSIS — F329 Major depressive disorder, single episode, unspecified: Secondary | ICD-10-CM | POA: Diagnosis not present

## 2022-06-22 DIAGNOSIS — E7800 Pure hypercholesterolemia, unspecified: Secondary | ICD-10-CM | POA: Diagnosis not present

## 2022-06-22 DIAGNOSIS — F419 Anxiety disorder, unspecified: Secondary | ICD-10-CM | POA: Diagnosis not present

## 2022-06-22 DIAGNOSIS — E1169 Type 2 diabetes mellitus with other specified complication: Secondary | ICD-10-CM | POA: Diagnosis not present

## 2022-06-22 DIAGNOSIS — M158 Other polyosteoarthritis: Secondary | ICD-10-CM | POA: Diagnosis not present

## 2022-08-24 DIAGNOSIS — E119 Type 2 diabetes mellitus without complications: Secondary | ICD-10-CM | POA: Diagnosis not present

## 2022-09-19 ENCOUNTER — Other Ambulatory Visit: Payer: Self-pay | Admitting: Family Medicine

## 2022-09-19 ENCOUNTER — Ambulatory Visit
Admission: RE | Admit: 2022-09-19 | Discharge: 2022-09-19 | Disposition: A | Discharge status: Home / Self Care | Payer: PPO | Source: Ambulatory Visit | Attending: Family Medicine | Admitting: Family Medicine

## 2022-09-19 DIAGNOSIS — M25551 Pain in right hip: Secondary | ICD-10-CM

## 2022-09-19 DIAGNOSIS — M25552 Pain in left hip: Secondary | ICD-10-CM | POA: Diagnosis not present

## 2022-09-19 DIAGNOSIS — Z23 Encounter for immunization: Secondary | ICD-10-CM | POA: Diagnosis not present

## 2022-09-19 DIAGNOSIS — Z6827 Body mass index (BMI) 27.0-27.9, adult: Secondary | ICD-10-CM | POA: Diagnosis not present

## 2022-09-25 DIAGNOSIS — M79605 Pain in left leg: Secondary | ICD-10-CM | POA: Diagnosis not present

## 2022-09-28 DIAGNOSIS — M79662 Pain in left lower leg: Secondary | ICD-10-CM | POA: Diagnosis not present

## 2022-09-28 DIAGNOSIS — M5416 Radiculopathy, lumbar region: Secondary | ICD-10-CM | POA: Diagnosis not present

## 2022-10-02 DIAGNOSIS — M25572 Pain in left ankle and joints of left foot: Secondary | ICD-10-CM | POA: Diagnosis not present

## 2022-10-03 DIAGNOSIS — M79662 Pain in left lower leg: Secondary | ICD-10-CM | POA: Diagnosis not present

## 2022-10-03 DIAGNOSIS — M5416 Radiculopathy, lumbar region: Secondary | ICD-10-CM | POA: Diagnosis not present

## 2022-10-06 DIAGNOSIS — M79662 Pain in left lower leg: Secondary | ICD-10-CM | POA: Diagnosis not present

## 2022-10-06 DIAGNOSIS — M5416 Radiculopathy, lumbar region: Secondary | ICD-10-CM | POA: Diagnosis not present

## 2022-10-07 DIAGNOSIS — N390 Urinary tract infection, site not specified: Secondary | ICD-10-CM | POA: Diagnosis not present

## 2022-10-10 DIAGNOSIS — M5416 Radiculopathy, lumbar region: Secondary | ICD-10-CM | POA: Diagnosis not present

## 2022-10-10 DIAGNOSIS — M79662 Pain in left lower leg: Secondary | ICD-10-CM | POA: Diagnosis not present

## 2022-10-13 DIAGNOSIS — M79662 Pain in left lower leg: Secondary | ICD-10-CM | POA: Diagnosis not present

## 2022-10-13 DIAGNOSIS — M5416 Radiculopathy, lumbar region: Secondary | ICD-10-CM | POA: Diagnosis not present

## 2022-10-14 DIAGNOSIS — M7918 Myalgia, other site: Secondary | ICD-10-CM | POA: Diagnosis not present

## 2022-10-14 DIAGNOSIS — N390 Urinary tract infection, site not specified: Secondary | ICD-10-CM | POA: Diagnosis not present

## 2022-10-17 DIAGNOSIS — Z87440 Personal history of urinary (tract) infections: Secondary | ICD-10-CM | POA: Diagnosis not present

## 2022-10-17 DIAGNOSIS — N390 Urinary tract infection, site not specified: Secondary | ICD-10-CM | POA: Diagnosis not present

## 2022-10-17 DIAGNOSIS — Z87442 Personal history of urinary calculi: Secondary | ICD-10-CM | POA: Diagnosis not present

## 2022-11-06 DIAGNOSIS — H524 Presbyopia: Secondary | ICD-10-CM | POA: Diagnosis not present

## 2022-11-06 DIAGNOSIS — H0100A Unspecified blepharitis right eye, upper and lower eyelids: Secondary | ICD-10-CM | POA: Diagnosis not present

## 2022-11-06 DIAGNOSIS — E119 Type 2 diabetes mellitus without complications: Secondary | ICD-10-CM | POA: Diagnosis not present

## 2022-11-06 DIAGNOSIS — H40013 Open angle with borderline findings, low risk, bilateral: Secondary | ICD-10-CM | POA: Diagnosis not present

## 2022-11-06 DIAGNOSIS — H52203 Unspecified astigmatism, bilateral: Secondary | ICD-10-CM | POA: Diagnosis not present

## 2022-11-06 DIAGNOSIS — H43393 Other vitreous opacities, bilateral: Secondary | ICD-10-CM | POA: Diagnosis not present

## 2022-11-06 DIAGNOSIS — H2513 Age-related nuclear cataract, bilateral: Secondary | ICD-10-CM | POA: Diagnosis not present

## 2022-11-06 DIAGNOSIS — H16223 Keratoconjunctivitis sicca, not specified as Sjogren's, bilateral: Secondary | ICD-10-CM | POA: Diagnosis not present

## 2022-11-06 DIAGNOSIS — H0100B Unspecified blepharitis left eye, upper and lower eyelids: Secondary | ICD-10-CM | POA: Diagnosis not present

## 2022-11-06 DIAGNOSIS — H5203 Hypermetropia, bilateral: Secondary | ICD-10-CM | POA: Diagnosis not present

## 2022-11-06 DIAGNOSIS — Z7984 Long term (current) use of oral hypoglycemic drugs: Secondary | ICD-10-CM | POA: Diagnosis not present

## 2022-11-06 DIAGNOSIS — D22112 Melanocytic nevi of right lower eyelid, including canthus: Secondary | ICD-10-CM | POA: Diagnosis not present

## 2022-11-13 DIAGNOSIS — Z23 Encounter for immunization: Secondary | ICD-10-CM | POA: Diagnosis not present

## 2022-11-13 DIAGNOSIS — Z6827 Body mass index (BMI) 27.0-27.9, adult: Secondary | ICD-10-CM | POA: Diagnosis not present

## 2022-11-13 DIAGNOSIS — S61313A Laceration without foreign body of left middle finger with damage to nail, initial encounter: Secondary | ICD-10-CM | POA: Diagnosis not present

## 2022-11-13 DIAGNOSIS — E1169 Type 2 diabetes mellitus with other specified complication: Secondary | ICD-10-CM | POA: Diagnosis not present

## 2022-12-26 DIAGNOSIS — Z6827 Body mass index (BMI) 27.0-27.9, adult: Secondary | ICD-10-CM | POA: Diagnosis not present

## 2022-12-26 DIAGNOSIS — E1169 Type 2 diabetes mellitus with other specified complication: Secondary | ICD-10-CM | POA: Diagnosis not present

## 2022-12-26 DIAGNOSIS — Z853 Personal history of malignant neoplasm of breast: Secondary | ICD-10-CM | POA: Diagnosis not present

## 2022-12-26 DIAGNOSIS — M158 Other polyosteoarthritis: Secondary | ICD-10-CM | POA: Diagnosis not present

## 2022-12-26 DIAGNOSIS — F321 Major depressive disorder, single episode, moderate: Secondary | ICD-10-CM | POA: Diagnosis not present

## 2022-12-26 DIAGNOSIS — E7800 Pure hypercholesterolemia, unspecified: Secondary | ICD-10-CM | POA: Diagnosis not present

## 2022-12-26 DIAGNOSIS — Z5320 Procedure and treatment not carried out because of patient's decision for unspecified reasons: Secondary | ICD-10-CM | POA: Diagnosis not present

## 2022-12-26 DIAGNOSIS — I10 Essential (primary) hypertension: Secondary | ICD-10-CM | POA: Diagnosis not present

## 2022-12-26 DIAGNOSIS — E119 Type 2 diabetes mellitus without complications: Secondary | ICD-10-CM | POA: Diagnosis not present

## 2022-12-26 DIAGNOSIS — F419 Anxiety disorder, unspecified: Secondary | ICD-10-CM | POA: Diagnosis not present

## 2023-02-14 DIAGNOSIS — S80862A Insect bite (nonvenomous), left lower leg, initial encounter: Secondary | ICD-10-CM | POA: Diagnosis not present

## 2023-02-14 DIAGNOSIS — Z6827 Body mass index (BMI) 27.0-27.9, adult: Secondary | ICD-10-CM | POA: Diagnosis not present

## 2023-02-14 DIAGNOSIS — W57XXXA Bitten or stung by nonvenomous insect and other nonvenomous arthropods, initial encounter: Secondary | ICD-10-CM | POA: Diagnosis not present

## 2023-05-01 DIAGNOSIS — H6123 Impacted cerumen, bilateral: Secondary | ICD-10-CM | POA: Diagnosis not present

## 2023-05-01 DIAGNOSIS — S80862D Insect bite (nonvenomous), left lower leg, subsequent encounter: Secondary | ICD-10-CM | POA: Diagnosis not present

## 2023-05-01 DIAGNOSIS — W57XXXD Bitten or stung by nonvenomous insect and other nonvenomous arthropods, subsequent encounter: Secondary | ICD-10-CM | POA: Diagnosis not present

## 2023-05-10 DIAGNOSIS — Z87440 Personal history of urinary (tract) infections: Secondary | ICD-10-CM | POA: Diagnosis not present

## 2023-05-10 DIAGNOSIS — Z09 Encounter for follow-up examination after completed treatment for conditions other than malignant neoplasm: Secondary | ICD-10-CM | POA: Diagnosis not present

## 2023-05-10 DIAGNOSIS — Z87442 Personal history of urinary calculi: Secondary | ICD-10-CM | POA: Diagnosis not present

## 2023-05-14 DIAGNOSIS — H903 Sensorineural hearing loss, bilateral: Secondary | ICD-10-CM | POA: Diagnosis not present

## 2023-05-14 DIAGNOSIS — H6123 Impacted cerumen, bilateral: Secondary | ICD-10-CM | POA: Diagnosis not present

## 2023-05-28 DIAGNOSIS — R5383 Other fatigue: Secondary | ICD-10-CM | POA: Diagnosis not present

## 2023-05-28 DIAGNOSIS — R509 Fever, unspecified: Secondary | ICD-10-CM | POA: Diagnosis not present

## 2023-05-28 DIAGNOSIS — R0981 Nasal congestion: Secondary | ICD-10-CM | POA: Diagnosis not present

## 2023-05-28 DIAGNOSIS — U071 COVID-19: Secondary | ICD-10-CM | POA: Diagnosis not present

## 2023-06-11 DIAGNOSIS — J029 Acute pharyngitis, unspecified: Secondary | ICD-10-CM | POA: Diagnosis not present

## 2023-06-11 DIAGNOSIS — R051 Acute cough: Secondary | ICD-10-CM | POA: Diagnosis not present

## 2023-06-15 DIAGNOSIS — R0981 Nasal congestion: Secondary | ICD-10-CM | POA: Diagnosis not present

## 2023-06-15 DIAGNOSIS — R590 Localized enlarged lymph nodes: Secondary | ICD-10-CM | POA: Diagnosis not present

## 2023-06-29 DIAGNOSIS — H40013 Open angle with borderline findings, low risk, bilateral: Secondary | ICD-10-CM | POA: Diagnosis not present

## 2023-06-29 DIAGNOSIS — H0100A Unspecified blepharitis right eye, upper and lower eyelids: Secondary | ICD-10-CM | POA: Diagnosis not present

## 2023-06-29 DIAGNOSIS — H0100B Unspecified blepharitis left eye, upper and lower eyelids: Secondary | ICD-10-CM | POA: Diagnosis not present

## 2023-06-29 DIAGNOSIS — H16223 Keratoconjunctivitis sicca, not specified as Sjogren's, bilateral: Secondary | ICD-10-CM | POA: Diagnosis not present

## 2023-07-10 DIAGNOSIS — F419 Anxiety disorder, unspecified: Secondary | ICD-10-CM | POA: Diagnosis not present

## 2023-07-10 DIAGNOSIS — Z853 Personal history of malignant neoplasm of breast: Secondary | ICD-10-CM | POA: Diagnosis not present

## 2023-07-10 DIAGNOSIS — I10 Essential (primary) hypertension: Secondary | ICD-10-CM | POA: Diagnosis not present

## 2023-07-10 DIAGNOSIS — E119 Type 2 diabetes mellitus without complications: Secondary | ICD-10-CM | POA: Diagnosis not present

## 2023-07-10 DIAGNOSIS — Z23 Encounter for immunization: Secondary | ICD-10-CM | POA: Diagnosis not present

## 2023-07-10 DIAGNOSIS — F321 Major depressive disorder, single episode, moderate: Secondary | ICD-10-CM | POA: Diagnosis not present

## 2023-07-10 DIAGNOSIS — Z5320 Procedure and treatment not carried out because of patient's decision for unspecified reasons: Secondary | ICD-10-CM | POA: Diagnosis not present

## 2023-07-10 DIAGNOSIS — Z6826 Body mass index (BMI) 26.0-26.9, adult: Secondary | ICD-10-CM | POA: Diagnosis not present

## 2023-07-10 DIAGNOSIS — M859 Disorder of bone density and structure, unspecified: Secondary | ICD-10-CM | POA: Diagnosis not present

## 2023-07-10 DIAGNOSIS — E7800 Pure hypercholesterolemia, unspecified: Secondary | ICD-10-CM | POA: Diagnosis not present

## 2023-07-10 DIAGNOSIS — M158 Other polyosteoarthritis: Secondary | ICD-10-CM | POA: Diagnosis not present

## 2023-07-24 DIAGNOSIS — Z6826 Body mass index (BMI) 26.0-26.9, adult: Secondary | ICD-10-CM | POA: Diagnosis not present

## 2023-07-24 DIAGNOSIS — R350 Frequency of micturition: Secondary | ICD-10-CM | POA: Diagnosis not present

## 2023-07-24 DIAGNOSIS — W57XXXA Bitten or stung by nonvenomous insect and other nonvenomous arthropods, initial encounter: Secondary | ICD-10-CM | POA: Diagnosis not present

## 2023-07-24 DIAGNOSIS — R319 Hematuria, unspecified: Secondary | ICD-10-CM | POA: Diagnosis not present

## 2023-07-27 DIAGNOSIS — M85852 Other specified disorders of bone density and structure, left thigh: Secondary | ICD-10-CM | POA: Diagnosis not present

## 2023-07-27 DIAGNOSIS — M8580 Other specified disorders of bone density and structure, unspecified site: Secondary | ICD-10-CM | POA: Diagnosis not present

## 2023-07-27 DIAGNOSIS — Z780 Asymptomatic menopausal state: Secondary | ICD-10-CM | POA: Diagnosis not present

## 2023-07-27 DIAGNOSIS — Z79899 Other long term (current) drug therapy: Secondary | ICD-10-CM | POA: Diagnosis not present

## 2023-07-27 DIAGNOSIS — Z1231 Encounter for screening mammogram for malignant neoplasm of breast: Secondary | ICD-10-CM | POA: Diagnosis not present

## 2023-09-20 DIAGNOSIS — Z6826 Body mass index (BMI) 26.0-26.9, adult: Secondary | ICD-10-CM | POA: Diagnosis not present

## 2023-09-20 DIAGNOSIS — N952 Postmenopausal atrophic vaginitis: Secondary | ICD-10-CM | POA: Diagnosis not present

## 2023-09-20 DIAGNOSIS — R3915 Urgency of urination: Secondary | ICD-10-CM | POA: Diagnosis not present

## 2024-01-16 DIAGNOSIS — Z853 Personal history of malignant neoplasm of breast: Secondary | ICD-10-CM | POA: Diagnosis not present

## 2024-01-16 DIAGNOSIS — F321 Major depressive disorder, single episode, moderate: Secondary | ICD-10-CM | POA: Diagnosis not present

## 2024-01-16 DIAGNOSIS — Z5320 Procedure and treatment not carried out because of patient's decision for unspecified reasons: Secondary | ICD-10-CM | POA: Diagnosis not present

## 2024-01-16 DIAGNOSIS — E119 Type 2 diabetes mellitus without complications: Secondary | ICD-10-CM | POA: Diagnosis not present

## 2024-01-16 DIAGNOSIS — H16221 Keratoconjunctivitis sicca, not specified as Sjogren's, right eye: Secondary | ICD-10-CM | POA: Diagnosis not present

## 2024-01-16 DIAGNOSIS — I10 Essential (primary) hypertension: Secondary | ICD-10-CM | POA: Diagnosis not present

## 2024-01-16 DIAGNOSIS — Z6827 Body mass index (BMI) 27.0-27.9, adult: Secondary | ICD-10-CM | POA: Diagnosis not present

## 2024-01-16 DIAGNOSIS — F419 Anxiety disorder, unspecified: Secondary | ICD-10-CM | POA: Diagnosis not present

## 2024-01-16 DIAGNOSIS — M158 Other polyosteoarthritis: Secondary | ICD-10-CM | POA: Diagnosis not present

## 2024-01-16 DIAGNOSIS — E7800 Pure hypercholesterolemia, unspecified: Secondary | ICD-10-CM | POA: Diagnosis not present

## 2024-01-16 DIAGNOSIS — M859 Disorder of bone density and structure, unspecified: Secondary | ICD-10-CM | POA: Diagnosis not present

## 2024-01-30 DIAGNOSIS — H40013 Open angle with borderline findings, low risk, bilateral: Secondary | ICD-10-CM | POA: Diagnosis not present

## 2024-01-30 DIAGNOSIS — H5203 Hypermetropia, bilateral: Secondary | ICD-10-CM | POA: Diagnosis not present

## 2024-01-30 DIAGNOSIS — Z7984 Long term (current) use of oral hypoglycemic drugs: Secondary | ICD-10-CM | POA: Diagnosis not present

## 2024-01-30 DIAGNOSIS — H52203 Unspecified astigmatism, bilateral: Secondary | ICD-10-CM | POA: Diagnosis not present

## 2024-01-30 DIAGNOSIS — H524 Presbyopia: Secondary | ICD-10-CM | POA: Diagnosis not present

## 2024-01-30 DIAGNOSIS — H0100B Unspecified blepharitis left eye, upper and lower eyelids: Secondary | ICD-10-CM | POA: Diagnosis not present

## 2024-01-30 DIAGNOSIS — H47393 Other disorders of optic disc, bilateral: Secondary | ICD-10-CM | POA: Diagnosis not present

## 2024-01-30 DIAGNOSIS — H2513 Age-related nuclear cataract, bilateral: Secondary | ICD-10-CM | POA: Diagnosis not present

## 2024-01-30 DIAGNOSIS — H43393 Other vitreous opacities, bilateral: Secondary | ICD-10-CM | POA: Diagnosis not present

## 2024-01-30 DIAGNOSIS — E119 Type 2 diabetes mellitus without complications: Secondary | ICD-10-CM | POA: Diagnosis not present

## 2024-01-30 DIAGNOSIS — H0100A Unspecified blepharitis right eye, upper and lower eyelids: Secondary | ICD-10-CM | POA: Diagnosis not present

## 2024-01-30 DIAGNOSIS — H16223 Keratoconjunctivitis sicca, not specified as Sjogren's, bilateral: Secondary | ICD-10-CM | POA: Diagnosis not present

## 2024-02-21 DIAGNOSIS — H6123 Impacted cerumen, bilateral: Secondary | ICD-10-CM | POA: Diagnosis not present

## 2024-02-21 DIAGNOSIS — H903 Sensorineural hearing loss, bilateral: Secondary | ICD-10-CM | POA: Diagnosis not present

## 2024-04-17 DIAGNOSIS — E119 Type 2 diabetes mellitus without complications: Secondary | ICD-10-CM | POA: Diagnosis not present

## 2024-04-17 DIAGNOSIS — L84 Corns and callosities: Secondary | ICD-10-CM | POA: Diagnosis not present

## 2024-04-17 DIAGNOSIS — G5793 Unspecified mononeuropathy of bilateral lower limbs: Secondary | ICD-10-CM | POA: Diagnosis not present

## 2024-06-06 DIAGNOSIS — R5381 Other malaise: Secondary | ICD-10-CM | POA: Diagnosis not present

## 2024-06-06 DIAGNOSIS — R3989 Other symptoms and signs involving the genitourinary system: Secondary | ICD-10-CM | POA: Diagnosis not present

## 2024-06-06 DIAGNOSIS — R5383 Other fatigue: Secondary | ICD-10-CM | POA: Diagnosis not present

## 2024-06-06 DIAGNOSIS — N390 Urinary tract infection, site not specified: Secondary | ICD-10-CM | POA: Diagnosis not present

## 2024-07-23 DIAGNOSIS — H16221 Keratoconjunctivitis sicca, not specified as Sjogren's, right eye: Secondary | ICD-10-CM | POA: Diagnosis not present

## 2024-07-23 DIAGNOSIS — I10 Essential (primary) hypertension: Secondary | ICD-10-CM | POA: Diagnosis not present

## 2024-07-23 DIAGNOSIS — Z1331 Encounter for screening for depression: Secondary | ICD-10-CM | POA: Diagnosis not present

## 2024-07-23 DIAGNOSIS — E7800 Pure hypercholesterolemia, unspecified: Secondary | ICD-10-CM | POA: Diagnosis not present

## 2024-07-23 DIAGNOSIS — F419 Anxiety disorder, unspecified: Secondary | ICD-10-CM | POA: Diagnosis not present

## 2024-07-23 DIAGNOSIS — M158 Other polyosteoarthritis: Secondary | ICD-10-CM | POA: Diagnosis not present

## 2024-07-23 DIAGNOSIS — Z5320 Procedure and treatment not carried out because of patient's decision for unspecified reasons: Secondary | ICD-10-CM | POA: Diagnosis not present

## 2024-07-23 DIAGNOSIS — Z23 Encounter for immunization: Secondary | ICD-10-CM | POA: Diagnosis not present

## 2024-07-23 DIAGNOSIS — F321 Major depressive disorder, single episode, moderate: Secondary | ICD-10-CM | POA: Diagnosis not present

## 2024-07-23 DIAGNOSIS — Z6826 Body mass index (BMI) 26.0-26.9, adult: Secondary | ICD-10-CM | POA: Diagnosis not present

## 2024-07-23 DIAGNOSIS — M859 Disorder of bone density and structure, unspecified: Secondary | ICD-10-CM | POA: Diagnosis not present

## 2024-07-23 DIAGNOSIS — E1140 Type 2 diabetes mellitus with diabetic neuropathy, unspecified: Secondary | ICD-10-CM | POA: Diagnosis not present

## 2024-08-05 DIAGNOSIS — H40013 Open angle with borderline findings, low risk, bilateral: Secondary | ICD-10-CM | POA: Diagnosis not present

## 2024-08-05 DIAGNOSIS — H16223 Keratoconjunctivitis sicca, not specified as Sjogren's, bilateral: Secondary | ICD-10-CM | POA: Diagnosis not present

## 2024-08-05 NOTE — Progress Notes (Signed)
 08/05/24   [facility] Ophthalmology - [place] Visit Note     CHIEF COMPLAINT Patient presents for Glaucoma   HISTORY OF PRESENT ILLNESS: Tammy Gilbert is a 78 y.o. female who presents to the clinic today for:   HPI     Glaucoma   I, the attending physician, performed the HPI with the patient and updated the documentation appropriately.        Comments   LV:01/30/2024  Patient returns for  VF 24-2, OCT NFL, IOP CK.  Patient states vision is stable since last visit. Reports frequent tearing that she is associating with current allergy problems. Denies eye pain, redness, dryness or photophobia.  Denies flashes, floaters or dark curtain/veil.   Uses  Refresh and Systane (PF) tears 3-4 times daily both eyes.  Pataday PRN         Last edited by Lynwood Arthea Plover, MD on 08/05/2024 10:31 AM.      CURRENT MEDICATIONS: Medications Ordered Prior to Encounter[1]  Referring physician: No referring provider defined for this encounter.  ALLERGIES Allergies[2]  PAST MEDICAL HISTORY Medical History[3] Surgical History[4]  FAMILY HISTORY Family History[5]  SOCIAL HISTORY Social History[6]      OPHTHALMIC EXAM:  Base Eye Exam     Visual Acuity (Snellen - Linear)       Right Left   Dist cc 20/25 20/20 -2    Correction: Glasses         Tonometry (JZF: Applanation: Fluress OU, 10:29 AM)       Right Left   Pressure 14 15  JZF MD to check        Pupils       Pupils Shape React APD   Right PERRL Round Slow None   Left PERRL Round Slow None         Neuro/Psych     Oriented x3: Yes   Mood/Affect: Normal           Slit Lamp and Fundus Exam     External Exam       Right Left   External Normal Normal         Slit Lamp Exam       Right Left   Lids/Lashes <60mm nevus LL on margin centrally, 3mm papilloma medial UL, 1mm centrally papilloma UL, no ulceration, no lash loss of any lesion, Blepharitis/MGD UL, LL, mild ectropion LL  Blepharitis/MGD UL, LL   Conjunctiva/Sclera White and quiet White and quiet   Cornea 1+ SPK 1+ SPK   Anterior Chamber Deep and quiet Deep and quiet   Iris Round and reactive Round and reactive   Lens 2+ NSC, Vacuoles 2+ NSC, Vacuoles         Fundus Exam       Right Left   Posterior Vitreous Vitreous floaters Vitreous floaters   Disc Normal Normal   C/D Ratio 0.7 0.65           Refraction     Wearing Rx       Sphere Cylinder Axis Add   Right +1.25 -0.50 082 +2.75   Left +0.75 -0.75 070 +2.75    Age: 64yr   Type: Bifocal             IMAGING AND PROCEDURES: Humphrey Visual Field - OU - Both Eyes       Humphrey Visual Field Interpretation  Test Date: 08/05/2024. Threshold was 24-2.   Right Eye  Diagnosis: Glaucoma suspect  Patient Cooperation: good.  Reliability: Reliable.  VFI: 100.  Fixation Losses: 2/10 fixation losses.  False Positives: 0 %.  False Negatives: 0 %.  Findings: within normal limits.  Visual field is stable .  Impression: same as initial diagnosis.   Left Eye  Diagnosis: Glaucoma suspect  Patient Cooperation: good.  Reliability: Reliable.  VFI: 99.  Fixation Losses: 0/10 fixation losses.  False Positives: 0 %.  False Negatives: 0 %.  Findings: within normal limits.  Visual field is stable .  Impression: same as initial diagnosis.   Notes OD: no glaucomatous defects OS: no glaucomatous defects      OCT, Optic Nerve - OU - Both Eyes       Optic Nerve OCT Interpretation  Test Date: 08/05/2024.  Diagnosis: Glaucoma suspect   Right Eye  Patient Cooperation: good.  Technically good study. Blink Artifact: No.  Decreased Signal Strength: No  Findings: Normal nerve fiber layer thickness.  Progression: There was no change in the nerve fiber layer.  Impression: same as initial diagnosis.   Left Eye  Patient Cooperation: good.  Technically good study. Blink Artifact: No.  Decreased Signal Strength: No  Findings:  Normal nerve fiber layer thickness.  Progression: There was no change in the nerve fiber layer.  Impression: same as initial diagnosis.   Notes OD: increased cupping, no nfl thinning OS: increased cupping, no nfl thinning      ASSESSMENT/PLAN:  1. Open angle with borderline findings and low glaucoma risk in both eyes  Humphrey Visual Field - OU - Both Eyes   OCT, Optic Nerve - OU - Both Eyes    2. 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  Unremarkable VF reviewed with pt today, Continue to monitor without drops   K Sicca OU: stable, use Artifical Tears QID and Gel at bed time, daily. Discontinued Restasis due to cost ($30 a month). She is okay with using artificial tears.     Medication ordered this visit:  No orders of the defined types were placed in this encounter.   Return in about 6 months (around 02/03/2025) for DEE, OCT MAC, IOP CK, BY JZF.  Patient Instructions  For dry eyes, please use artificial tears 3-4 times daily and gel at bedtime. Please be advised that gel/gel drops may cause blurred vision.   Keep scheduled appointment, call sooner if needed.    Explained the diagnoses, plan, and follow up with the patient and they expressed understanding.  Patient expressed understanding of the importance of proper follow up care.    Abbreviations: M myopia (nearsighted); A astigmatism; H hyperopia (farsighted); P presbyopia; Mrx spectacle prescription;  CTL contact lenses; OD right eye; OS left eye; OU both eyes  XT exotropia; ET esotropia; PEK punctate epithelial keratitis; PEE punctate epithelial erosions; DES dry eye syndrome; MGD meibomian gland dysfunction; ATs artificial tears; PFAT's preservative free artificial tears; NSC nuclear sclerotic cataract; PSC posterior subcapsular cataract; ERM epi-retinal membrane; PVD posterior vitreous detachment; RD retinal  detachment; DM diabetes mellitus; DR diabetic retinopathy; NPDR non-proliferative diabetic retinopathy; PDR proliferative diabetic retinopathy; CSME clinically significant macular edema; DME diabetic macular edema; dbh dot blot hemorrhages; CWS cotton wool spot; POAG primary open angle glaucoma; C/D cup-to-disc ratio; HVF humphrey visual field; GVF goldmann visual field; OCT optical coherence tomography; IOP intraocular pressure; BRVO Branch retinal vein occlusion; CRVO central retinal vein occlusion; CRAO central retinal artery occlusion; BRAO branch retinal artery occlusion;  RT retinal tear; SB scleral buckle; PPV pars plana vitrectomy; VH Vitreous hemorrhage; PRP panretinal laser photocoagulation; IVK intravitreal kenalog ; VMT vitreomacular traction; MH Macular hole;  NVD neovascularization of the disc; NVE neovascularization elsewhere; AREDS age related eye disease study; ARMD age related macular degeneration; POAG primary open angle glaucoma; EBMD epithelial/anterior basement membrane dystrophy; ACIOL anterior chamber intraocular lens; IOL intraocular lens; PCIOL posterior chamber intraocular lens; Phaco/IOL phacoemulsification with intraocular lens placement; PRK photorefractive keratectomy; LASIK laser assisted in situ keratomileusis; HTN hypertension; DM diabetes mellitus; COPD chronic obstructive pulmonary disease  This document serves as a record of services personally performed by Lynwood Arthea Plover, MD .  It was created on their behalf by Olam Slade Prevatte, COT, a trained medical scribe, and Certified Ophthalmic Tech (COT). During the course of documenting the history, physical exam and medical decision making, I was functioning as a stage manager. The creation of this record is the provider's dictation and/or activities during the visit.  Electronically signed by Olam Slade Prevatte, COT 08/05/2024 10:28 AM  Electronically signed by: Lynwood Arthea Plover, MD 08/05/2024 10:32 AM         [1] Current Outpatient Medications on File Prior to Visit  Medication Sig Dispense Refill  . acetaminophen (TYLENOL) 500 mg tablet 3 tablets as needed    . ascorbic acid (VITAMIN C) 500 mg tablet as directed    . aspirin 81 mg cap Take 1 tablet by mouth daily.    SABRA aspirin 81 mg EC tablet 1 tablet    . biotin 10 mg tab tablet 1 tablet    . cholecalciferol (VITAMIN D3) 1,000 unit (25 mcg) tablet Take 1,000 Units by mouth Once Daily.    . cycloSPORINE (Restasis) 0.05 % ophthalmic emulsion PLACE 1 DROP INTO BOTH EYES 2 TIMES DAILY. PLEASE DISPENSE 90 DAYS SUPPLY 180 mL 3  . esomeprazole (NexIUM) 20 mg DR capsule Take  by mouth.    . estradioL (ESTRACE) 0.01 % (0.1 mg/gram) vaginal cream Apply in vagina with index finger a small amount every other night 42.5 g 3  . fluticasone propionate (FLONASE) 50 mcg/spray nasal spray USE 2 SPRAYS IN EACH NOSTRIL ONCE DAILY 16 g 11  . hydroCHLOROthiazide (HYDRODIURIL) 12.5 mg tablet Take 12.5 mg by mouth.    . L. acidophilus-L. rhamnosus (Florajen Women) 15 billion cell cap as directed    . LORazepam (ATIVAN) 0.5 mg tablet TAKE 1 TABLET THREE TIMES A DAY AS NEEDED FOR ANXIETY  3  . losartan-hydroCHLOROthiazide (HYZAAR) 50-12.5 mg per tablet TAKE 1 TABLET DAILY. 30 tablet 6  . magnesium 200 mg tab Take  by mouth.    . metFORMIN (GLUCOPHAGE) 500 mg tablet Take 500 mg by mouth 2 (two) times a day with meals.    . metoprolol succinate (TOPROL XL) 25 mg 24 hr tablet TAKE 2 TABLETS BY MOUTH ONCE DAILY    . nystatin (MYCOSTATIN) powder Apply  topically 2 (two) times a day as needed. 15 g 0  . spironolactone (ALDACTONE) 25 mg tablet TAKE 1 TABLET EVERY DAY 30 tablet 2  . sulfamethoxazole-trimethoprim (BACTRIM DS) 800-160 mg per tablet Take  by mouth 2 (two) times a day.    . traZODone (DESYREL) 150 mg tablet TAKE 1 TABLET AT BEDTIME 30 tablet 6  . turmeric 400 mg cap 500 mg.    . venlafaxine (EFFEXOR XR) 150 mg 24 hr capsule TAKE 1 CAPSULE DAILY.  5    No current facility-administered medications on file prior to visit.  [2]  Allergies Allergen Reactions  . Doxycycline Nausea Only  . Sulfamethoxazole-Trimethoprim Other (See Comments)  . Codeine Sulfate GI Intolerance  [3] Past Medical History: Diagnosis Date  . Arthritis   . Breast cancer    (CMD)    Rt. 2012  . Cataract   . Diabetes mellitus   . Hypertension   . Skin cancer    pt diagnosed with skin cancer January 2023  [4] Past Surgical History: Procedure Laterality Date  . BREAST BIOPSY Right    Procedure: BREAST BIOPSY; 1982, bg  . BREAST BIOPSY Right    Procedure: BREAST BIOPSY; cancer 2012  . BREAST LUMPECTOMY Right    Procedure: BREAST LUMPECTOMY; 2012 with radiation  . HYSTERECTOMY      Procedure: HYSTERECTOMY; age 12  [5] Family History Problem Relation Name Age of Onset  . Breast cancer Mother  38  . Cataracts Mother    . Breast cancer Sister  43  . Cataracts Sister    . Macular degeneration Sister    . Cataracts Brother    . Cataracts Father    . Cataracts Maternal Grandmother    . Cataracts Maternal Grandfather    . Cataracts Paternal Grandmother    . Cataracts Paternal Grandfather    . Glaucoma Neg Hx    [6] Social History Tobacco Use  . Smoking status: Never  . Smokeless tobacco: Never  Vaping Use  . Vaping status: Never Used  Substance Use Topics  . Alcohol use: Never

## 2024-08-14 NOTE — Therapy (Signed)
 OUTPATIENT PHYSICAL THERAPY LOWER EXTREMITY EVALUATION   Patient Name: Tammy Gilbert MRN: [id number] DOB:[birth year], 78 y.o., female Today's Date: 08/18/2024   END OF SESSION:  PT End of Session - 08/18/24 1002     Visit Number 1    Date for Recertification  11/10/24    Authorization Type [employer]    Progress Note Due on Visit 10    PT Start Time 1002    PT Stop Time 1102    PT Time Calculation (min) 60 min    Activity Tolerance Patient tolerated treatment well    Behavior During Therapy WFL for tasks assessed/performed          Past Medical History:  Diagnosis Date   Arthritis    Breast cancer (HCC)    Diabetes mellitus without complication (HCC)    Diverticulitis    Hypertension    Past Surgical History:  Procedure Laterality Date   ABDOMINAL HYSTERECTOMY     BREAST LUMPECTOMY     right   CHOLECYSTECTOMY     There are no active problems to display for this patient.   PCP: Katina Pfeiffer, PA-C   REFERRING PROVIDER: Katina Pfeiffer, PA-C   REFERRING DIAG: M15.8 (ICD-10-CM) - Other osteoarthritis involving multiple joints - referral for hip pain, balance/strength issues  THERAPY DIAG:  Unsteadiness on feet  Repeated falls  Muscle weakness (generalized)  RATIONALE FOR EVALUATION AND TREATMENT: Rehabilitation  ONSET DATE: 1.5+ years ago, worsening over the last 6-8 months  NEXT MD VISIT: 6 months   SUBJECTIVE:                                                                                                                                                                                                         SUBJECTIVE STATEMENT: Pt reports she has trouble with all my joints.  Over the summer she had tingling in her legs.  The podiatrist tested her feet and she was lacking sensation but reports pain in her feet - she is afraid it is neuropathy but does not want to go see the MD about this yet.  She feels like she has been  stumbling more recently. She had 2 falls in the past 6 months but several more near misses where she has been able to catch herself.  She denies any dizziness or lightheadedness.  She denies pain currently but does use CBD oil for her hands, knees and hips.  PAIN: Are you having pain? No  PERTINENT HISTORY:  OA, HTN, HLD, DM-II, diabetic neuropathy, anxiety/depression  PRECAUTIONS: Fall  RED  FLAGS: None  WEIGHT BEARING RESTRICTIONS: No  FALLS:  Has patient fallen in last 6 months? Yes. Number of falls 2, 3x in the past year - usually losing her footing outside when working in the garden  LIVING ENVIRONMENT: Lives with: lives alone Lives in: [place] Stairs: Yes: External: 1 steps; on right going up Has following equipment at home: Single point cane and Environmental Consultant - 4 wheeled - *feels like she needs a grab bar installed to get into the tub  OCCUPATION: Retired  PLOF: Independent and Leisure: gardening, walking the dog around the yard, reading, adult coloring books  PATIENT GOALS: To not stumble so bad.   OBJECTIVE: (objective measures completed at initial evaluation unless otherwise dated)  DIAGNOSTIC FINDINGS:  09/20/2022 - DG HIP (WITH OR WITHOUT PELVIS) 2-3V LEFT FINDINGS: There is no evidence of hip fracture or dislocation. No significant arthropathy. No bony lesions or destruction.   IMPRESSION: Negative  PATIENT SURVEYS:  ABC scale: The Activities-Specific Balance Confidence (ABC) Scale 0% 10 20 30  40 50 60 70 80 90 100% No confidence<->completely confident  "How confident are you that you will not lose your balance or become unsteady when you . . .  Date tested 08/18/2024   Walk around the house 90%  2. Walk up or down stairs 30%  3. Bend over and pick up a slipper from in front of a closet floor 80%  4. Reach for a small can off a shelf at eye level 90%  5. Stand on tip toes and reach for something above your head 90%  6. Stand on a chair and reach for something 0%   7. Sweep the floor 90%  8. Walk outside the house to a car parked in the driveway 90%  9. Get into or out of a car 100%  10. Walk across a parking lot to the mall 60%  11. Walk up or down a ramp 20%  12. Walk in a crowded mall where people rapidly walk past you 20%  13. Are bumped into by people as you walk through the mall 20%  14. Step onto or off of an escalator while you are holding onto the railing 90%  15. Step onto or off an escalator while holding onto parcels such that you cannot hold onto the railing 50%  16. Walk outside on icy sidewalks 0%  Total: #/16 920 / 1600 = 57.5 %  Level of physical functioning: Moderate    LEFS  Extreme difficulty/unable (0), Quite a bit of difficulty (1), Moderate difficulty (2), Little difficulty (3), No difficulty (4) Survey date:  08/18/2024   Any of your usual work, housework or school activities 3  2. Usual hobbies, recreational or sporting activities 3  3. Getting into/out of the bath 3  4. Walking between rooms 4  5. Putting on socks/shoes 2  6. Squatting  0  7. Lifting an object, like a bag of groceries from the floor 3  8. Performing light activities around your home 3  9. Performing heavy activities around your home 2  10. Getting into/out of a car 0  11. Walking 2 blocks 0  12. Walking 1 mile 0  13. Going up/down 10 stairs (1 flight) 0  14. Standing for 1 hour 3  15.  sitting for 1 hour 0  16. Running on even ground 0  17. Running on uneven ground 0  18. Making sharp turns while running fast 0  19. Hopping  0  20. Rolling  over in bed 4  Score total:  30 / 80 = 37.5 %  Functional limitation: Moderate     COGNITION: Overall cognitive status: Within functional limits for tasks assessed    SENSATION: Impaired (numbness) in L>R foot, tingling in B LE  POSTURE:  rounded shoulders, forward head, and increased thoracic kyphosis  MUSCLE LENGTH: Hamstrings: Mild tight B ITB: Mild tight B Piriformis: Mild tight B Hip  flexors:  Quads:  Heelcord:   LOWER EXTREMITY ROM: Grossly WFL  LOWER EXTREMITY MMT:  MMT Right eval Left eval  Hip flexion 4- 3+  Hip extension 4- in S/L 4- in S/L  Hip abduction 3+ 3  Hip adduction 3- 3-  Hip internal rotation 4- 3+  Hip external rotation 3+ 3+  Knee flexion 4- 4-  Knee extension 4 4  Ankle dorsiflexion 4 4-  Ankle plantarflexion 4 (10 SLS HR) 4- (9 SLS HR)  Ankle inversion    Ankle eversion     (Blank rows = not tested)  FUNCTIONAL TESTS:  5 times sit to stand: 17.18 sec w/o UE assist Timed up and go (TUG): 9.69 sec 10 meter walk test: 7.93 sec Gait speed: 4.14 ft/sec Functional gait assessment: 21/30, 19-24 = medium risk fall  Functional Gait  Assessment  Gait Level Surface Walks 20 ft in less than 5.5 sec, no assistive devices, good speed, no evidence for imbalance, normal gait pattern, deviates no more than 6 in outside of the 12 in walkway width.   Change in Gait Speed Able to smoothly change walking speed without loss of balance or gait deviation. Deviate no more than 6 in outside of the 12 in walkway width.   Gait with Horizontal Head Turns Performs head turns smoothly with slight change in gait velocity (eg, minor disruption to smooth gait path), deviates 6-10 in outside 12 in walkway width, or uses an assistive device.   Gait with Vertical Head Turns Performs head turns with no change in gait. Deviates no more than 6 in outside 12 in walkway width.   Gait and Pivot Turn Pivot turns safely within 3 sec and stops quickly with no loss of balance.   Step Over Obstacle Is able to step over one shoe box (4.5 in total height) without changing gait speed. No evidence of imbalance.   Gait with Narrow Base of Support Ambulates less than 4 steps heel to toe or cannot perform without assistance.   Gait with Eyes Closed Walks 20 ft, uses assistive device, slower speed, mild gait deviations, deviates 6-10 in outside 12 in walkway width. Ambulates 20 ft in less than  9 sec but greater than 7 sec.   Ambulating Backwards Walks 20 ft, slow speed, abnormal gait pattern, evidence for imbalance, deviates 10-15 in outside 12 in walkway width.   Steps Alternating feet, must use rail.   Total Score 21   FGA comment: 19-24 = medium risk fall       Interpretation of scores: Non-Specific Older Adults Cutoff Score: <=22/30 = risk of falls Parkinson's Disease Cutoff score <15/30= fall risk (Hoehn & Yahr 1-4)  Minimally Clinically Important Difference (MCID)  Stroke (acute, subacute, and chronic) = MDC: 4.2 points Vestibular (acute) = MDC: 6 points Community Dwelling Older Adults =  MCID: 4 points Parkinson's Disease  =  MDC: 4.3 points  (Academy of Neu[employer]ic Physical Therapy (nd). Functional Gait Assessment. Retrieved from https://www.neuropt.org/docs/default-source/cpgs/core-outcome-measures/function-gait-assessment-pocket-guide-proof9-(2).pdf?sfvrsn=b56f35043_0.)  GAIT: Distance walked: clinic distances Assistive device utilized: None Level of assistance: Complete Independence Gait pattern:  WFL   TODAY'S TREATMENT:   08/18/2024 - Eval SELF CARE:  Reviewed eval findings and role of PT in addressing identified deficits as well as instruction in initial HEP (see below).    PATIENT EDUCATION:  Education details: PT eval findings, anticipated POC, standardized testing results and interpretation, and initial HEP  Person educated: Patient Education method: Explanation, Demonstration, Verbal cues, and Handouts Education comprehension: verbalized understanding, returned demonstration, verbal cues required, and needs further education  HOME EXERCISE PROGRAM: Access Code: [email or web address] URL: [email or web address] Date: 08/18/2024 Prepared by: Elijah Hidden  Exercises - Heel Toe Raises with Counter Support  - 1 x daily - 7 x weekly - 2 sets - 10 reps - 3 sec hold - Standing Hip Abduction with Counter Support  - 1 x daily - 7 x weekly - 2 sets - 10  reps - 2-3 sec hold - Standing Hip Extension with Counter Support  - 1 x daily - 7 x weekly - 2 sets - 10 reps - 2-3 sec hold - Standing March with Counter Support  - 1 x daily - 7 x weekly - 2 sets - 10 reps - 2-3 sec hold   ASSESSMENT:  CLINICAL IMPRESSION: Immaculate Crutcher Dubeau is a 78 y.o. female who was referred to physical therapy for evaluation and treatment for osteoarthritis of multiple joints with decreased strength and balance.   Patient reports she manages her pain with CBD oil with no pain reported on eval.  Her biggest concern is worsening unsteadiness on her feet with increased stumbling during gait.  She has had 2 falls in the past 6 months and 3 falls in the past year, with several near misses where she has been able to catch herself.  Patient has deficits in B LE flexibility, B LE strength, abnormal posture, and impaired balance which are interfering with ADLs and are impacting quality of life.  On LEFS patient scored 30/80 demonstrating moderate functional limitation.  ABC scale score of 57.5% indicates a moderate level of physical functioning.  Examination revealed patient is at risk for falls and functional decline as evidenced by the following objective test measures: 5xSTS of 17.18 sec (>15 sec indicates increased risk for falls and decreased BLE power) FGA score of 21/30 indicates a medium risk for falls.  Gait speed 4.14 ft/sec and TUG of 9.69 sec were WFL.  Nettie will benefit from skilled PT to address above deficits to improve mobility and activity tolerance with decreased pain interference and decreased risk for falls to help reach the maximal level of functional independence and mobility. Patient demonstrates understanding of this POC and is in agreement with this plan.    OBJECTIVE IMPAIRMENTS: Abnormal gait, decreased activity tolerance, decreased balance, decreased knowledge of condition, difficulty walking, decreased strength, decreased safety awareness, impaired  flexibility, improper body mechanics, postural dysfunction, and pain.   ACTIVITY LIMITATIONS: bending, standing, squatting, stairs, transfers, and locomotion level  PARTICIPATION LIMITATIONS: community activity and yard work  PERSONAL FACTORS: Fitness, Past/current experiences, Time since onset of injury/illness/exacerbation, and 3+ comorbidities: OA, HTN, HLD, DM-II, diabetic neuropathy, anxiety/depression are also affecting patient's functional outcome.   REHAB POTENTIAL: Good  CLINICAL DECISION MAKING: Evolving/moderate complexity  EVALUATION COMPLEXITY: Moderate   GOALS: Goals reviewed with patient? Yes  SHORT TERM GOALS: Target date: 09/29/2024  Patient will be independent with initial HEP. Baseline: Initial HEP provided on eval Goal status: INITIAL  2.  Patient will improve 5x STS time to </= 15 seconds to demonstrate improved functional strength and transfer  efficiency . Baseline: 17.18 sec Goal status: INITIAL  LONG TERM GOALS: Target date: 11/10/2024  Patient will be independent with advanced/ongoing HEP to improve outcomes and carryover.  Baseline:  Goal status: INITIAL  2.  Patient will demonstrate improved B LE strength to >/= 4+/5 for improved stability and ease of mobility. Baseline: Refer to above LE MMT table Goal status: INITIAL  3.  Patient will report >/= 40/80 on LEFS (MCID = 9 pts) to demonstrate improved functional ability. Baseline: 30 / 80 = 37.5 % Goal status: INITIAL  4.  Patient will demonstrate at least 25/30 on FGA to decrease risk of falls. Baseline: 21/30 Goal status: INITIAL   5.  Patient will report no further instances of falls.  Baseline: 2 falls in the past 6 months, 3 in the past year Goal status: INITIAL    PLAN:  PT FREQUENCY: 1-2x/week  PT DURATION: 12 weeks  PLANNED INTERVENTIONS: [id number]- PT Re-evaluation, 97750- Physical Performance Testing, 97110-Therapeutic exercises, 97530- Therapeutic activity, [id number]- Neuromuscular  re-education, 97535- Self Care, [id number]- Manual therapy, [phone]- Gait training, [phone]- Electrical stimulation (unattended), 97035- Ultrasound, [id number]- Ionotophoresis 4mg /ml Dexamethasone, 79439 (1-2 muscles), [id number] (3+ muscles)- Dry Needling, Patient/Family education, Balance training, Stair training, Taping, Joint mobilization, Vestibular training, Cryotherapy, and Moist heat  PLAN FOR NEXT SESSION: Review initial HEP; progress core and LE strengthening; balance training   Elijah CHRISTELLA Hidden, PT 08/18/2024, 4:54 PM

## 2024-08-18 ENCOUNTER — Ambulatory Visit: Attending: Family Medicine | Admitting: Physical Therapy

## 2024-08-18 ENCOUNTER — Encounter: Payer: Self-pay | Admitting: Physical Therapy

## 2024-08-18 ENCOUNTER — Other Ambulatory Visit: Payer: Self-pay

## 2024-08-18 DIAGNOSIS — R296 Repeated falls: Secondary | ICD-10-CM | POA: Diagnosis not present

## 2024-08-18 DIAGNOSIS — M6281 Muscle weakness (generalized): Secondary | ICD-10-CM | POA: Diagnosis not present

## 2024-08-18 DIAGNOSIS — R2681 Unsteadiness on feet: Secondary | ICD-10-CM | POA: Insufficient documentation

## 2024-08-26 ENCOUNTER — Ambulatory Visit

## 2024-08-26 DIAGNOSIS — R2681 Unsteadiness on feet: Secondary | ICD-10-CM | POA: Diagnosis not present

## 2024-08-26 DIAGNOSIS — M6281 Muscle weakness (generalized): Secondary | ICD-10-CM

## 2024-08-26 DIAGNOSIS — R296 Repeated falls: Secondary | ICD-10-CM

## 2024-08-26 NOTE — Therapy (Signed)
 OUTPATIENT PHYSICAL THERAPY LOWER EXTREMITY TREATMENT   Patient Name: Tammy Gilbert MRN: [id number] DOB:Jun 19, 1946, 78 y.o., female Today's Date: 08/26/2024   END OF SESSION:  PT End of Session - 08/26/24 1018     Visit Number 2    Date for Recertification  11/10/24    Authorization Type [employer]    Progress Note Due on Visit 10    PT Start Time 0934    PT Stop Time 1015    PT Time Calculation (min) 41 min    Activity Tolerance Patient tolerated treatment well    Behavior During Therapy WFL for tasks assessed/performed           Past Medical History:  Diagnosis Date   Arthritis    Breast cancer (HCC)    Diabetes mellitus without complication (HCC)    Diverticulitis    Hypertension    Past Surgical History:  Procedure Laterality Date   ABDOMINAL HYSTERECTOMY     BREAST LUMPECTOMY     right   CHOLECYSTECTOMY     There are no active problems to display for this patient.   PCP: Katina Pfeiffer, PA-C   REFERRING PROVIDER: Katina Pfeiffer, PA-C   REFERRING DIAG: M15.8 (ICD-10-CM) - Other osteoarthritis involving multiple joints - referral for hip pain, balance/strength issues  THERAPY DIAG:  Unsteadiness on feet  Repeated falls  Muscle weakness (generalized)  RATIONALE FOR EVALUATION AND TREATMENT: Rehabilitation  ONSET DATE: 1.5+ years ago, worsening over the last 6-8 months  NEXT MD VISIT: 6 months   SUBJECTIVE:                                                                                                                                                                                                         SUBJECTIVE STATEMENT: No falls or near misses. No pain     Pt reports she has trouble with all my joints.  Over the summer she had tingling in her legs.  The podiatrist tested her feet and she was lacking sensation but reports pain in her feet - she is afraid it is neuropathy but does not want to go see the MD about  this yet.  She feels like she has been stumbling more recently. She had 2 falls in the past 6 months but several more near misses where she has been able to catch herself.  She denies any dizziness or lightheadedness.  She denies pain currently but does use CBD oil for her hands, knees and hips.  PAIN: Are you having pain? No  PERTINENT HISTORY:  OA, HTN, HLD, DM-II, diabetic neuropathy, anxiety/depression  PRECAUTIONS: Fall  RED FLAGS: None  WEIGHT BEARING RESTRICTIONS: No  FALLS:  Has patient fallen in last 6 months? Yes. Number of falls 2, 3x in the past year - usually losing her footing outside when working in the garden  LIVING ENVIRONMENT: Lives with: lives alone Lives in: Golden Stairs: Yes: External: 1 steps; on right going up Has following equipment at home: Single point cane and Environmental Consultant - 4 wheeled - *feels like she needs a grab bar installed to get into the tub  OCCUPATION: Retired  PLOF: Independent and Leisure: gardening, walking the dog around the yard, reading, adult coloring books  PATIENT GOALS: To not stumble so bad.   OBJECTIVE: (objective measures completed at initial evaluation unless otherwise dated)  DIAGNOSTIC FINDINGS:  09/20/2022 - DG HIP (WITH OR WITHOUT PELVIS) 2-3V LEFT FINDINGS: There is no evidence of hip fracture or dislocation. No significant arthropathy. No bony lesions or destruction.   IMPRESSION: Negative  PATIENT SURVEYS:  ABC scale: The Activities-Specific Balance Confidence (ABC) Scale 0% 10 20 30  40 50 60 70 80 90 100% No confidence<->completely confident  "How confident are you that you will not lose your balance or become unsteady when you . . .  Date tested 08/18/2024   Walk around the house 90%  2. Walk up or down stairs 30%  3. Bend over and pick up a slipper from in front of a closet floor 80%  4. Reach for a small can off a shelf at eye level 90%  5. Stand on tip toes and reach for something above your head 90%  6. Stand  on a chair and reach for something 0%  7. Sweep the floor 90%  8. Walk outside the house to a car parked in the driveway 90%  9. Get into or out of a car 100%  10. Walk across a parking lot to the mall 60%  11. Walk up or down a ramp 20%  12. Walk in a crowded mall where people rapidly walk past you 20%  13. Are bumped into by people as you walk through the mall 20%  14. Step onto or off of an escalator while you are holding onto the railing 90%  15. Step onto or off an escalator while holding onto parcels such that you cannot hold onto the railing 50%  16. Walk outside on icy sidewalks 0%  Total: #/16 920 / 1600 = 57.5 %  Level of physical functioning: Moderate    LEFS  Extreme difficulty/unable (0), Quite a bit of difficulty (1), Moderate difficulty (2), Little difficulty (3), No difficulty (4) Survey date:  08/18/2024   Any of your usual work, housework or school activities 3  2. Usual hobbies, recreational or sporting activities 3  3. Getting into/out of the bath 3  4. Walking between rooms 4  5. Putting on socks/shoes 2  6. Squatting  0  7. Lifting an object, like a bag of groceries from the floor 3  8. Performing light activities around your home 3  9. Performing heavy activities around your home 2  10. Getting into/out of a car 0  11. Walking 2 blocks 0  12. Walking 1 mile 0  13. Going up/down 10 stairs (1 flight) 0  14. Standing for 1 hour 3  15.  sitting for 1 hour 0  16. Running on even ground 0  17. Running on uneven ground 0  18. Making sharp turns  while running fast 0  19. Hopping  0  20. Rolling over in bed 4  Score total:  30 / 80 = 37.5 %  Functional limitation: Moderate     COGNITION: Overall cognitive status: Within functional limits for tasks assessed    SENSATION: Impaired (numbness) in L>R foot, tingling in B LE  POSTURE:  rounded shoulders, forward head, and increased thoracic kyphosis  MUSCLE LENGTH: Hamstrings: Mild tight B ITB: Mild tight  B Piriformis: Mild tight B Hip flexors:  Quads:  Heelcord:   LOWER EXTREMITY ROM: Grossly WFL  LOWER EXTREMITY MMT:  MMT Right eval Left eval  Hip flexion 4- 3+  Hip extension 4- in S/L 4- in S/L  Hip abduction 3+ 3  Hip adduction 3- 3-  Hip internal rotation 4- 3+  Hip external rotation 3+ 3+  Knee flexion 4- 4-  Knee extension 4 4  Ankle dorsiflexion 4 4-  Ankle plantarflexion 4 (10 SLS HR) 4- (9 SLS HR)  Ankle inversion    Ankle eversion     (Blank rows = not tested)  FUNCTIONAL TESTS:  5 times sit to stand: 17.18 sec w/o UE assist Timed up and go (TUG): 9.69 sec 10 meter walk test: 7.93 sec Gait speed: 4.14 ft/sec Functional gait assessment: 21/30, 19-24 = medium risk fall  Functional Gait  Assessment  Gait Level Surface Walks 20 ft in less than 5.5 sec, no assistive devices, good speed, no evidence for imbalance, normal gait pattern, deviates no more than 6 in outside of the 12 in walkway width.   Change in Gait Speed Able to smoothly change walking speed without loss of balance or gait deviation. Deviate no more than 6 in outside of the 12 in walkway width.   Gait with Horizontal Head Turns Performs head turns smoothly with slight change in gait velocity (eg, minor disruption to smooth gait path), deviates 6-10 in outside 12 in walkway width, or uses an assistive device.   Gait with Vertical Head Turns Performs head turns with no change in gait. Deviates no more than 6 in outside 12 in walkway width.   Gait and Pivot Turn Pivot turns safely within 3 sec and stops quickly with no loss of balance.   Step Over Obstacle Is able to step over one shoe box (4.5 in total height) without changing gait speed. No evidence of imbalance.   Gait with Narrow Base of Support Ambulates less than 4 steps heel to toe or cannot perform without assistance.   Gait with Eyes Closed Walks 20 ft, uses assistive device, slower speed, mild gait deviations, deviates 6-10 in outside 12 in walkway  width. Ambulates 20 ft in less than 9 sec but greater than 7 sec.   Ambulating Backwards Walks 20 ft, slow speed, abnormal gait pattern, evidence for imbalance, deviates 10-15 in outside 12 in walkway width.   Steps Alternating feet, must use rail.   Total Score 21   FGA comment: 19-24 = medium risk fall       Interpretation of scores: Non-Specific Older Adults Cutoff Score: <=22/30 = risk of falls Parkinson's Disease Cutoff score <15/30= fall risk (Hoehn & Yahr 1-4)  Minimally Clinically Important Difference (MCID)  Stroke (acute, subacute, and chronic) = MDC: 4.2 points Vestibular (acute) = MDC: 6 points Community Dwelling Older Adults =  MCID: 4 points Parkinson's Disease  =  MDC: 4.3 points  (Academy of Neu[employer]ic Physical Therapy (nd). Functional Gait Assessment. Retrieved from https://www.neuropt.org/docs/default-source/cpgs/core-outcome-measures/function-gait-assessment-pocket-guide-proof9-(2).pdf?sfvrsn=b21f35043_0.)  GAIT: Distance walked: clinic  distances Assistive device utilized: None Level of assistance: Complete Independence Gait pattern: WFL   TODAY'S TREATMENT:  08/26/24 NEUROMUSCULAR RE-EDUCATION: To improve coordination, kinesthesia, posture, and proprioception.  At counter with UE support: Standing heel raises x 10 B Standing hip abduction x 10 B Standing hip extension x 10 B Standing marching x 10 B TRA sets hooklying 10x3' TRA set with hooklying clam RTB - did not feel much Bridge + TRA RTB x 10 SLR + QS x 10 BLE Tandem gait and backward walking along counter 3x each; added head turns as well  THERAPEUTIC EXERCISE: To improve strength, endurance, ROM, and flexibility.  Nustep L3x5min- fatigue S/L clamshells RTB x 10 BLE S/L reverse clamshells with light manual resistance x 10 RLE; no resistance LLE   08/18/2024 - Eval SELF CARE:  Reviewed eval findings and role of PT in addressing identified deficits as well as instruction in initial HEP (see below).     PATIENT EDUCATION:  Education details: HEP update- SLR, bridge, clamshell  Person educated: Patient Education method: Explanation, Demonstration, Verbal cues, and Handouts Education comprehension: verbalized understanding, returned demonstration, verbal cues required, and needs further education  HOME EXERCISE PROGRAM: Access Code: [email or web address] URL: [email or web address] Date: 08/26/2024 Prepared by: Tammy Gilbert  Exercises - Heel Toe Raises with Counter Support  - 1 x daily - 7 x weekly - 2 sets - 10 reps - 3 sec hold - Standing Hip Abduction with Counter Support  - 1 x daily - 7 x weekly - 2 sets - 10 reps - 2-3 sec hold - Standing Hip Extension with Counter Support  - 1 x daily - 7 x weekly - 2 sets - 10 reps - 2-3 sec hold - Standing March with Counter Support  - 1 x daily - 7 x weekly - 2 sets - 10 reps - 2-3 sec hold - Supine Bridge with Resistance Band  - 1 x daily - 7 x weekly - 2 sets - 10 reps - Supine Active Straight Leg Raise  - 1 x daily - 7 x weekly - 2 sets - 10 reps - Clamshell with Resistance  - 1 x daily - 7 x weekly - 2 sets - 10 reps   ASSESSMENT:  CLINICAL IMPRESSION: Reviewed HEP with good understanding shown, patient did need cues with the hip extension to avoid posterior toe tap. We focused on isolated hip strengthening, per eval she showed a great deal of hip weakness globally. Cues provided throughout visit for form. We also worked on dynamic gait/balance as able. Pt responded well.   Tammy Gilbert is a 78 y.o. female who was referred to physical therapy for evaluation and treatment for osteoarthritis of multiple joints with decreased strength and balance.   Patient reports she manages her pain with CBD oil with no pain reported on eval.  Her biggest concern is worsening unsteadiness on her feet with increased stumbling during gait.  She has had 2 falls in the past 6 months and 3 falls in the past year, with several near misses where she  has been able to catch herself.  Patient has deficits in B LE flexibility, B LE strength, abnormal posture, and impaired balance which are interfering with ADLs and are impacting quality of life.  On LEFS patient scored 30/80 demonstrating moderate functional limitation.  ABC scale score of 57.5% indicates a moderate level of physical functioning.  Examination revealed patient is at risk for falls and functional decline as evidenced by the following objective test  measures: 5xSTS of 17.18 sec (>15 sec indicates increased risk for falls and decreased BLE power) FGA score of 21/30 indicates a medium risk for falls.  Gait speed 4.14 ft/sec and TUG of 9.69 sec were WFL.  Tammy Gilbert will benefit from skilled PT to address above deficits to improve mobility and activity tolerance with decreased pain interference and decreased risk for falls to help reach the maximal level of functional independence and mobility. Patient demonstrates understanding of this POC and is in agreement with this plan.    OBJECTIVE IMPAIRMENTS: Abnormal gait, decreased activity tolerance, decreased balance, decreased knowledge of condition, difficulty walking, decreased strength, decreased safety awareness, impaired flexibility, improper body mechanics, postural dysfunction, and pain.   ACTIVITY LIMITATIONS: bending, standing, squatting, stairs, transfers, and locomotion level  PARTICIPATION LIMITATIONS: community activity and yard work  PERSONAL FACTORS: Fitness, Past/current experiences, Time since onset of injury/illness/exacerbation, and 3+ comorbidities: OA, HTN, HLD, DM-II, diabetic neuropathy, anxiety/depression are also affecting patient's functional outcome.   REHAB POTENTIAL: Good  CLINICAL DECISION MAKING: Evolving/moderate complexity  EVALUATION COMPLEXITY: Moderate   GOALS: Goals reviewed with patient? Yes  SHORT TERM GOALS: Target date: 09/29/2024  Patient will be independent with initial HEP. Baseline: Initial HEP  provided on eval Goal status: IN PROGRESS- 08/26/24- just about met, only needed cues with hip extension  2.  Patient will improve 5x STS time to </= 15 seconds to demonstrate improved functional strength and transfer efficiency . Baseline: 17.18 sec Goal status: INITIAL  LONG TERM GOALS: Target date: 11/10/2024  Patient will be independent with advanced/ongoing HEP to improve outcomes and carryover.  Baseline:  Goal status: INITIAL  2.  Patient will demonstrate improved B LE strength to >/= 4+/5 for improved stability and ease of mobility. Baseline: Refer to above LE MMT table Goal status: INITIAL  3.  Patient will report >/= 40/80 on LEFS (MCID = 9 pts) to demonstrate improved functional ability. Baseline: 30 / 80 = 37.5 % Goal status: INITIAL  4.  Patient will demonstrate at least 25/30 on FGA to decrease risk of falls. Baseline: 21/30 Goal status: INITIAL   5.  Patient will report no further instances of falls.  Baseline: 2 falls in the past 6 months, 3 in the past year Goal status: INITIAL    PLAN:  PT FREQUENCY: 1-2x/week  PT DURATION: 12 weeks  PLANNED INTERVENTIONS: 97164- PT Re-evaluation, 97750- Physical Performance Testing, 97110-Therapeutic exercises, 97530- Therapeutic activity, [id number]- Neuromuscular re-education, 97535- Self Care, [id number]- Manual therapy, [phone]- Gait training, [phone]- Electrical stimulation (unattended), 97035- Ultrasound, 02966- Ionotophoresis 4mg /ml Dexamethasone, [id number] (1-2 muscles), [id number] (3+ muscles)- Dry Needling, Patient/Family education, Balance training, Stair training, Taping, Joint mobilization, Vestibular training, Cryotherapy, and Moist heat  PLAN FOR NEXT SESSION:  progress core and LE strengthening; dynamic balance training, maybe try unsteady surface gait   Tammy Gilbert, PTA 08/26/2024, 10:18 AM

## 2024-09-02 ENCOUNTER — Ambulatory Visit

## 2024-09-02 DIAGNOSIS — R2681 Unsteadiness on feet: Secondary | ICD-10-CM

## 2024-09-02 DIAGNOSIS — R296 Repeated falls: Secondary | ICD-10-CM

## 2024-09-02 DIAGNOSIS — M6281 Muscle weakness (generalized): Secondary | ICD-10-CM

## 2024-09-02 NOTE — Therapy (Signed)
 OUTPATIENT PHYSICAL THERAPY LOWER EXTREMITY TREATMENT   Patient Name: MARICELA SCHREUR MRN: [id number] DOB:[birth year], 78 y.o., female Today's Date: 09/02/2024   END OF SESSION:     Past Medical History:  Diagnosis Date   Arthritis    Breast cancer (HCC)    Diabetes mellitus without complication (HCC)    Diverticulitis    Hypertension    Past Surgical History:  Procedure Laterality Date   ABDOMINAL HYSTERECTOMY     BREAST LUMPECTOMY     right   CHOLECYSTECTOMY     There are no active problems to display for this patient.   PCP: Katina Pfeiffer, PA-C   REFERRING PROVIDER: Katina Pfeiffer, PA-C   REFERRING DIAG: M15.8 (ICD-10-CM) - Other osteoarthritis involving multiple joints - referral for hip pain, balance/strength issues  THERAPY DIAG:  Unsteadiness on feet  Repeated falls  Muscle weakness (generalized)  RATIONALE FOR EVALUATION AND TREATMENT: Rehabilitation  ONSET DATE: 1.5+ years ago, worsening over the last 6-8 months  NEXT MD VISIT: 6 months   SUBJECTIVE:                                                                                                                                                                                                         SUBJECTIVE STATEMENT: Had a stumble the other day, walking and turned her head     Pt reports she has trouble with all my joints.  Over the summer she had tingling in her legs.  The podiatrist tested her feet and she was lacking sensation but reports pain in her feet - she is afraid it is neuropathy but does not want to go see the MD about this yet.  She feels like she has been stumbling more recently. She had 2 falls in the past 6 months but several more near misses where she has been able to catch herself.  She denies any dizziness or lightheadedness.  She denies pain currently but does use CBD oil for her hands, knees and hips.  PAIN: Are you having pain? No  PERTINENT HISTORY:  OA,  HTN, HLD, DM-II, diabetic neuropathy, anxiety/depression  PRECAUTIONS: Fall  RED FLAGS: None  WEIGHT BEARING RESTRICTIONS: No  FALLS:  Has patient fallen in last 6 months? Yes. Number of falls 2, 3x in the past year - usually losing her footing outside when working in the garden  LIVING ENVIRONMENT: Lives with: lives alone Lives in: [place] Stairs: Yes: External: 1 steps; on right going up Has following equipment at home: Single point cane and Environmental Consultant - 4 wheeled - *  feels like she needs a grab bar installed to get into the tub  OCCUPATION: Retired  PLOF: Independent and Leisure: gardening, walking the dog around the yard, reading, adult coloring books  PATIENT GOALS: To not stumble so bad.   OBJECTIVE: (objective measures completed at initial evaluation unless otherwise dated)  DIAGNOSTIC FINDINGS:  09/20/2022 - DG HIP (WITH OR WITHOUT PELVIS) 2-3V LEFT FINDINGS: There is no evidence of hip fracture or dislocation. No significant arthropathy. No bony lesions or destruction.   IMPRESSION: Negative  PATIENT SURVEYS:  ABC scale: The Activities-Specific Balance Confidence (ABC) Scale 0% 10 20 30  40 50 60 70 80 90 100% No confidence<->completely confident  "How confident are you that you will not lose your balance or become unsteady when you . . .  Date tested 08/18/2024   Walk around the house 90%  2. Walk up or down stairs 30%  3. Bend over and pick up a slipper from in front of a closet floor 80%  4. Reach for a small can off a shelf at eye level 90%  5. Stand on tip toes and reach for something above your head 90%  6. Stand on a chair and reach for something 0%  7. Sweep the floor 90%  8. Walk outside the house to a car parked in the driveway 90%  9. Get into or out of a car 100%  10. Walk across a parking lot to the mall 60%  11. Walk up or down a ramp 20%  12. Walk in a crowded mall where people rapidly walk past you 20%  13. Are bumped into by people as you walk  through the mall 20%  14. Step onto or off of an escalator while you are holding onto the railing 90%  15. Step onto or off an escalator while holding onto parcels such that you cannot hold onto the railing 50%  16. Walk outside on icy sidewalks 0%  Total: #/16 920 / 1600 = 57.5 %  Level of physical functioning: Moderate    LEFS  Extreme difficulty/unable (0), Quite a bit of difficulty (1), Moderate difficulty (2), Little difficulty (3), No difficulty (4) Survey date:  08/18/2024   Any of your usual work, housework or school activities 3  2. Usual hobbies, recreational or sporting activities 3  3. Getting into/out of the bath 3  4. Walking between rooms 4  5. Putting on socks/shoes 2  6. Squatting  0  7. Lifting an object, like a bag of groceries from the floor 3  8. Performing light activities around your home 3  9. Performing heavy activities around your home 2  10. Getting into/out of a car 0  11. Walking 2 blocks 0  12. Walking 1 mile 0  13. Going up/down 10 stairs (1 flight) 0  14. Standing for 1 hour 3  15.  sitting for 1 hour 0  16. Running on even ground 0  17. Running on uneven ground 0  18. Making sharp turns while running fast 0  19. Hopping  0  20. Rolling over in bed 4  Score total:  30 / 80 = 37.5 %  Functional limitation: Moderate     COGNITION: Overall cognitive status: Within functional limits for tasks assessed    SENSATION: Impaired (numbness) in L>R foot, tingling in B LE  POSTURE:  rounded shoulders, forward head, and increased thoracic kyphosis  MUSCLE LENGTH: Hamstrings: Mild tight B ITB: Mild tight B Piriformis: Mild tight B  Hip flexors:  Quads:  Heelcord:   LOWER EXTREMITY ROM: Grossly WFL  LOWER EXTREMITY MMT:  MMT Right eval Left eval  Hip flexion 4- 3+  Hip extension 4- in S/L 4- in S/L  Hip abduction 3+ 3  Hip adduction 3- 3-  Hip internal rotation 4- 3+  Hip external rotation 3+ 3+  Knee flexion 4- 4-  Knee extension 4 4   Ankle dorsiflexion 4 4-  Ankle plantarflexion 4 (10 SLS HR) 4- (9 SLS HR)  Ankle inversion    Ankle eversion     (Blank rows = not tested)  FUNCTIONAL TESTS:  5 times sit to stand: 17.18 sec w/o UE assist Timed up and go (TUG): 9.69 sec 10 meter walk test: 7.93 sec Gait speed: 4.14 ft/sec Functional gait assessment: 21/30, 19-24 = medium risk fall  Functional Gait  Assessment  Gait Level Surface Walks 20 ft in less than 5.5 sec, no assistive devices, good speed, no evidence for imbalance, normal gait pattern, deviates no more than 6 in outside of the 12 in walkway width.   Change in Gait Speed Able to smoothly change walking speed without loss of balance or gait deviation. Deviate no more than 6 in outside of the 12 in walkway width.   Gait with Horizontal Head Turns Performs head turns smoothly with slight change in gait velocity (eg, minor disruption to smooth gait path), deviates 6-10 in outside 12 in walkway width, or uses an assistive device.   Gait with Vertical Head Turns Performs head turns with no change in gait. Deviates no more than 6 in outside 12 in walkway width.   Gait and Pivot Turn Pivot turns safely within 3 sec and stops quickly with no loss of balance.   Step Over Obstacle Is able to step over one shoe box (4.5 in total height) without changing gait speed. No evidence of imbalance.   Gait with Narrow Base of Support Ambulates less than 4 steps heel to toe or cannot perform without assistance.   Gait with Eyes Closed Walks 20 ft, uses assistive device, slower speed, mild gait deviations, deviates 6-10 in outside 12 in walkway width. Ambulates 20 ft in less than 9 sec but greater than 7 sec.   Ambulating Backwards Walks 20 ft, slow speed, abnormal gait pattern, evidence for imbalance, deviates 10-15 in outside 12 in walkway width.   Steps Alternating feet, must use rail.   Total Score 21   FGA comment: 19-24 = medium risk fall       Interpretation of  scores: Non-Specific Older Adults Cutoff Score: <=22/30 = risk of falls Parkinson's Disease Cutoff score <15/30= fall risk (Hoehn & Yahr 1-4)  Minimally Clinically Important Difference (MCID)  Stroke (acute, subacute, and chronic) = MDC: 4.2 points Vestibular (acute) = MDC: 6 points Community Dwelling Older Adults =  MCID: 4 points Parkinson's Disease  =  MDC: 4.3 points  (Academy of Neu[employer]ic Physical Therapy (nd). Functional Gait Assessment. Retrieved from https://www.neuropt.org/docs/default-source/cpgs/core-outcome-measures/function-gait-assessment-pocket-guide-proof9-(2).pdf?sfvrsn=b55f35043_0.)  GAIT: Distance walked: clinic distances Assistive device utilized: None Level of assistance: Complete Independence Gait pattern: WFL   TODAY'S TREATMENT:  09/02/24 NEUROMUSCULAR RE-EDUCATION: To improve coordination, kinesthesia, posture, and proprioception.  Gait with head turns- horizontal, vertical 3x164ft At chair with UE support: Standing heel raises x 10 B Standing hip abduction YTB at ankles x 10 B Standing hip extension YTB at ankles x 10 B Standing marching YTB at ankles x 10 B Squats x 10- cues to hinge hips to avoid knee  pain Tandem stance on airex 2x30'  Toe tap clock reach 12 to 6 R/L 4x each- more unsteady going PL, P Sidesteps balance beam 3x with therapist holding hand  THERAPEUTIC EXERCISE: To improve strength, endurance, ROM, and flexibility.  Nustep L4x76min 08/26/24 NEUROMUSCULAR RE-EDUCATION: To improve coordination, kinesthesia, posture, and proprioception.  At counter with UE support: Standing heel raises x 10 B Standing hip abduction x 10 B Standing hip extension x 10 B Standing marching x 10 B TRA sets hooklying 10x3' TRA set with hooklying clam RTB - did not feel much Bridge + TRA RTB x 10 SLR + QS x 10 BLE Tandem gait and backward walking along counter 3x each; added head turns as well  THERAPEUTIC EXERCISE: To improve strength, endurance, ROM,  and flexibility.  Nustep L3x5min- fatigue S/L clamshells RTB x 10 BLE S/L reverse clamshells with light manual resistance x 10 RLE; no resistance LLE   08/18/2024 - Eval SELF CARE:  Reviewed eval findings and role of PT in addressing identified deficits as well as instruction in initial HEP (see below).    PATIENT EDUCATION:  Education details: HEP update- SLR, bridge, clamshell  Person educated: Patient Education method: Explanation, Demonstration, Verbal cues, and Handouts Education comprehension: verbalized understanding, returned demonstration, verbal cues required, and needs further education  HOME EXERCISE PROGRAM: Access Code: [email or web address] URL: [email or web address] Date: 08/26/2024 Prepared by: Berania Peedin  Exercises - Heel Toe Raises with Counter Support  - 1 x daily - 7 x weekly - 2 sets - 10 reps - 3 sec hold - Standing Hip Abduction with Counter Support  - 1 x daily - 7 x weekly - 2 sets - 10 reps - 2-3 sec hold - Standing Hip Extension with Counter Support  - 1 x daily - 7 x weekly - 2 sets - 10 reps - 2-3 sec hold - Standing March with Counter Support  - 1 x daily - 7 x weekly - 2 sets - 10 reps - 2-3 sec hold - Supine Bridge with Resistance Band  - 1 x daily - 7 x weekly - 2 sets - 10 reps - Supine Active Straight Leg Raise  - 1 x daily - 7 x weekly - 2 sets - 10 reps - Clamshell with Resistance  - 1 x daily - 7 x weekly - 2 sets - 10 reps   ASSESSMENT:  CLINICAL IMPRESSION: Progressed proximal LE strengthening and worked on dynamic balance challenges on unsteady surface. We added resistance to HEP strengthening exercises but I did not add YTB to HEP, just yet. Pt showing some challenges with dynamic balance activities today with occasional LOB but able to recover with help of therapist   RAI SINAGRA is a 78 y.o. female who was referred to physical therapy for evaluation and treatment for osteoarthritis of multiple joints with decreased strength  and balance.   Patient reports she manages her pain with CBD oil with no pain reported on eval.  Her biggest concern is worsening unsteadiness on her feet with increased stumbling during gait.  She has had 2 falls in the past 6 months and 3 falls in the past year, with several near misses where she has been able to catch herself.  Patient has deficits in B LE flexibility, B LE strength, abnormal posture, and impaired balance which are interfering with ADLs and are impacting quality of life.  On LEFS patient scored 30/80 demonstrating moderate functional limitation.  ABC scale score of 57.5% indicates a moderate level  of physical functioning.  Examination revealed patient is at risk for falls and functional decline as evidenced by the following objective test measures: 5xSTS of 17.18 sec (>15 sec indicates increased risk for falls and decreased BLE power) FGA score of 21/30 indicates a medium risk for falls.  Gait speed 4.14 ft/sec and TUG of 9.69 sec were WFL.  Rubie will benefit from skilled PT to address above deficits to improve mobility and activity tolerance with decreased pain interference and decreased risk for falls to help reach the maximal level of functional independence and mobility. Patient demonstrates understanding of this POC and is in agreement with this plan.    OBJECTIVE IMPAIRMENTS: Abnormal gait, decreased activity tolerance, decreased balance, decreased knowledge of condition, difficulty walking, decreased strength, decreased safety awareness, impaired flexibility, improper body mechanics, postural dysfunction, and pain.   ACTIVITY LIMITATIONS: bending, standing, squatting, stairs, transfers, and locomotion level  PARTICIPATION LIMITATIONS: community activity and yard work  PERSONAL FACTORS: Fitness, Past/current experiences, Time since onset of injury/illness/exacerbation, and 3+ comorbidities: OA, HTN, HLD, DM-II, diabetic neuropathy, anxiety/depression are also affecting patient's  functional outcome.   REHAB POTENTIAL: Good  CLINICAL DECISION MAKING: Evolving/moderate complexity  EVALUATION COMPLEXITY: Moderate   GOALS: Goals reviewed with patient? Yes  SHORT TERM GOALS: Target date: 09/29/2024  Patient will be independent with initial HEP. Baseline: Initial HEP provided on eval Goal status: IN PROGRESS- 08/26/24- just about met, only needed cues with hip extension  2.  Patient will improve 5x STS time to </= 15 seconds to demonstrate improved functional strength and transfer efficiency . Baseline: 17.18 sec Goal status: INITIAL  LONG TERM GOALS: Target date: 11/10/2024  Patient will be independent with advanced/ongoing HEP to improve outcomes and carryover.  Baseline:  Goal status: INITIAL  2.  Patient will demonstrate improved B LE strength to >/= 4+/5 for improved stability and ease of mobility. Baseline: Refer to above LE MMT table Goal status: INITIAL  3.  Patient will report >/= 40/80 on LEFS (MCID = 9 pts) to demonstrate improved functional ability. Baseline: 30 / 80 = 37.5 % Goal status: INITIAL  4.  Patient will demonstrate at least 25/30 on FGA to decrease risk of falls. Baseline: 21/30 Goal status: INITIAL   5.  Patient will report no further instances of falls.  Baseline: 2 falls in the past 6 months, 3 in the past year Goal status: INITIAL    PLAN:  PT FREQUENCY: 1-2x/week  PT DURATION: 12 weeks  PLANNED INTERVENTIONS: [id number]- PT Re-evaluation, 97750- Physical Performance Testing, 97110-Therapeutic exercises, 97530- Therapeutic activity, [id number]- Neuromuscular re-education, 97535- Self Care, [id number]- Manual therapy, [phone]- Gait training, [phone]- Electrical stimulation (unattended), 97035- Ultrasound, 02966- Ionotophoresis 4mg /ml Dexamethasone, 79439 (1-2 muscles), [id number] (3+ muscles)- Dry Needling, Patient/Family education, Balance training, Stair training, Taping, Joint mobilization, Vestibular training, Cryotherapy, and Moist  heat  PLAN FOR NEXT SESSION:  progress core and LE strengthening; dynamic balance training, maybe try unsteady surface gait   Viera Okonski L Suella Cogar, PTA 09/02/2024, 10:21 AM

## 2024-09-04 DIAGNOSIS — Z86000 Personal history of in-situ neoplasm of breast: Secondary | ICD-10-CM | POA: Diagnosis not present

## 2024-09-04 DIAGNOSIS — F419 Anxiety disorder, unspecified: Secondary | ICD-10-CM | POA: Diagnosis not present

## 2024-09-04 DIAGNOSIS — Z0189 Encounter for other specified special examinations: Secondary | ICD-10-CM | POA: Diagnosis not present

## 2024-09-04 DIAGNOSIS — R197 Diarrhea, unspecified: Secondary | ICD-10-CM | POA: Diagnosis not present

## 2024-09-04 DIAGNOSIS — G629 Polyneuropathy, unspecified: Secondary | ICD-10-CM | POA: Diagnosis not present

## 2024-09-04 DIAGNOSIS — F322 Major depressive disorder, single episode, severe without psychotic features: Secondary | ICD-10-CM | POA: Diagnosis not present

## 2024-09-04 DIAGNOSIS — E119 Type 2 diabetes mellitus without complications: Secondary | ICD-10-CM | POA: Diagnosis not present

## 2024-09-04 NOTE — Progress Notes (Signed)
 [facility] [place] [address] [place] [address] [phone]  Tammy Gilbert April 13, 1946 09/04/2024   Chief Complaint  Patient presents with  . New Patient    Previous PCP was Tammy Gilbert at [facility] in [place]. Switching due to distance of drive for appointments. No concerns today.  . Gait Problem    Currently doing PT to help with balance.    HPI:  History of Present Illness The patient presents to establish care.  She has been managing diabetes for several years, with her last A1c reading at 7.2. She reports a decrease in appetite, which she does not attribute to her cancer medication, Tamoxifen. Blood work was last done in either September or October 2025. She has not consulted a specialist or received counseling for over two decades. She is currently on metformin 500 mg twice daily.  She has been experiencing diarrhea for a few years, which occasionally disrupts her sleep. She has not sought evaluation for this issue and has declined a colonoscopy. She also reports occasional constipation and uses stool softeners as needed. She has a history of diverticulitis and underwent a hysterectomy in [place]. She also had a large kidney stone removed during that period. She was referred to a gastroenterologist due to frequent bowel movements (5 times daily), who performed a colonoscopy and removed polyps. She was informed of esophageal scarring, but another physician disagreed with this claim. She has been experiencing indigestion at night for several years, which she manages with daily antacids.  She is taking a PPI daily  She has anxiety and takes lorazepam as needed, refilling the prescription (30 tabs) approximately twice a year. She is also on venlafaxine 150 mg daily.  Hypertension is currently well-managed. She is on a combination of hydrochlorothiazide and losartan, and metoprolol for blood pressure control.  She  has a history of breast cancer, diagnosed 12 years ago, and was under the care of Dr. Madison. She has not had any follow-up with her oncologist recently.  She has been experiencing frequent stumbling and is currently undergoing physical therapy. Tingling in her left leg is reported, particularly during hot weather.  She is recently seen podiatry who told her that her tuning fork exam was slightly diminished in her toes.  She is presently doing physical therapy.  She does not recall ever being told specifically that she had neuropathy.  She reports that she had her urine albumin and her diabetic foot exam at her last visit with her previous provider 1 to 2 months ago  She has a history of recurrent urinary tract infections.  She has a history of diathesis recti and diverticulitis. Occasional soreness in the hernia area is reported.  She has been experiencing weight loss without intentional effort.  PAST SURGICAL HISTORY: - Hysterectomy - Kidney stone removal - Colonoscopy with polyp removal   Current Medications[1] Allergies[2]    The following portions of the patient's history were reviewed and updated as appropriate: allergies, current medications, PFH, PMH, past social history, past surgical history and problem list.    Physical Exam  Vitals:   09/04/24 1328  BP: 134/82  Pulse: 70  Resp: 18  SpO2: 98%  Weight: 70.8 kg (156 lb)  Height: 1.638 m (5' 4.5)    GENERAL APPEARANCE: 78 y.o., White or Caucasian,  Physical Exam Constitutional:      General: She is not in acute distress.    Appearance: Normal appearance.  HENT:  Head: Normocephalic and atraumatic.     Comments: Grossly unremarkable  Neck:     Thyroid : No thyromegaly.     Vascular: No carotid bruit.  Cardiovascular:     Rate and Rhythm: Normal rate and regular rhythm.     Pulses: Normal pulses.     Heart sounds: Normal heart sounds. No murmur heard.    No friction rub. No gallop.  Pulmonary:      Effort: Pulmonary effort is normal.     Breath sounds: Normal breath sounds. No wheezing, rhonchi or rales.  Abdominal:     Palpations: Abdomen is soft.     Tenderness: There is no abdominal tenderness. There is no guarding or rebound.     Comments: No HSM  There is a prominent mid abdominal diaphysis.  No evidence for true hernia.  Mildly uncomfortable exam but not painful. Prominent xiphoid process when palpating the epigastrium.  Benign.   Musculoskeletal:        General: Normal range of motion.     Cervical back: Normal range of motion and neck supple. No rigidity or tenderness.     Right lower leg: No edema.     Left lower leg: No edema.  Lymphadenopathy:     Cervical: No cervical adenopathy.  Skin:    General: Skin is warm and dry.  Neurological:     General: No focal deficit present.     Deep Tendon Reflexes: Reflexes normal.     Comments: Neuro exam grossly unremarkable. Gait is intact.  Psychiatric:     Comments: Behavior is normal, patient seems a bit down when discussing her husband who died a few years back.  Conversation appropriate.          Diagnosis:  1. Encounter to establish care      2. Diabetes mellitus type 2 without retinopathy (HCC)      3. Type 2 diabetes mellitus without complication, without long-term current use of insulin    (CMD)      4. History of ductal carcinoma in situ (DCIS) of breast  [facility] Breast Screening Tomo Bilat    5. Neuropathy      6. Anxiety      7. Diarrhea, unspecified type  Ambulatory referral to Gastroenterology        Plan:  Assessment & Plan 1.  Establish care   2. Diabetes Mellitus: - Hemoglobin A1c level is currently at 6.9-7.2. Continue present medication, stressed proper diet and encourage exercise.  Recheck in 2 months.  At that time it should be due date for her hemoglobin A1c.  Remaining who monitor a lab at that time. For 3.  Diarrhea, chronic We discussed multifactorial differential including  irritable bowel syndrome, inflammatory disease, medication side effect among others.  - GI referral for further evaluation.  Patient has adamantly against colonoscopy, but we discussed that there are other evaluations that can be done.  She is willing to at least have a conversation.  -In the interim, will discontinue metformin for a week to assess if it is contributing to diarrhea. If symptoms improve, metformin will be reintroduced the following week. If diarrhea worsens upon reintroduction, an alternative medication will be considered. If there is no change in symptoms, metformin will be continued as it is not the cause of diarrhea.  4. Hypertension: - Blood pressure is well-controlled. - Continue current regimen of hydrochlorothiazide and losartan combination therapy.  5.  History of breast Cancer: - She has been in remission for 12 years.  4. Anxiety: - Currently taking venlafaxine 150 mg daily and lorazepam as needed. Lorazepam refill is not needed at this time.  5. Diarrhea: - She has been experiencing diarrhea for several years, which may be due to irritable bowel syndrome, infection, or a colon-related issue. - Referral to a gastroenterologist will be made for further evaluation and management.  6. Neuropathy: - Reports tingling in her left leg, which may be due to diabetes or a B12 deficiency. - B12 level test will be conducted during the next visit.  If she wishes she can do an over-the-counter B12 as a trial for improvement.  7.  Diathesis recti - She has a small mid abdominal defect that is not causing significant discomfort. - No immediate intervention is required unless symptoms worsen.  8. Weight Loss: - Reports unintentional weight loss.  Continue to monitor.  Previously evaluation no source.  9. Health Maintenance: - Received influenza vaccine this year but declined COVID-19 boosters and RSV vaccine. Has had two doses of the shingles vaccine and declined the third  due to side effects.  Follow-up: The patient will follow up in 2 months.  Results Labs  - Hemoglobin A1c: 7.2    No orders of the defined types were placed in this encounter.   Orders Placed This Encounter  Procedures  . MG Breast Screening Tomo Bilat  . Ambulatory referral to Gastroenterology      Return in about 2 years (around 09/04/2026) for DM/HTN/LIPID.    I discussed with patient warning signs and symptoms and seeking Emergency Room care as needed.   This document serves as a record of services personally performed by Achille CANDIE Crease, MD  Electronically signed by Achille Bethena Crease, MD 09/04/2024 1:44 PM          [1] Current Outpatient Medications  Medication Sig Dispense Refill  . acetaminophen (TYLENOL) 500 mg tablet 3 tablets as needed    . biotin 10 mg tab tablet 1 tablet    . carboxymethylcellulose (Refresh Tears) 0.5 % drop ophthalmic solution Administer into both eyes as needed for dry eyes.    . cholecalciferol (VITAMIN D3) 1,000 unit (25 mcg) tablet Take 1,000 Units by mouth Once Daily.    . fluticasone propionate (FLONASE) 50 mcg/spray nasal spray USE 2 SPRAYS IN EACH NOSTRIL ONCE DAILY 16 g 11  . L. acidophilus-L. rhamnosus (Florajen Women) 15 billion cell cap as directed    . lansoprazole (PREVACID) 15 mg delayed-release capsule Take 15 mg by mouth every morning before breakfast.    . LORazepam (ATIVAN) 0.5 mg tablet TAKE 1 TABLET THREE TIMES A DAY AS NEEDED FOR ANXIETY  3  . losartan-hydroCHLOROthiazide (HYZAAR) 50-12.5 mg per tablet TAKE 1 TABLET DAILY. 30 tablet 6  . magnesium 200 mg tab Take  by mouth.    . metFORMIN (GLUCOPHAGE) 500 mg tablet Take 500 mg by mouth 2 (two) times a day with meals.    . metoprolol succinate (TOPROL XL) 25 mg 24 hr tablet TAKE 2 TABLETS BY MOUTH ONCE DAILY    . propylene glycol (SYSTANE BALANCE OPHT) Administer into affected eye(s).    SABRA spironolactone (ALDACTONE) 25 mg tablet TAKE 1 TABLET EVERY DAY 30  tablet 2  . traZODone (DESYREL) 150 mg tablet TAKE 1 TABLET AT BEDTIME 30 tablet 6  . turmeric 400 mg cap 500 mg.    . venlafaxine (EFFEXOR XR) 150 mg 24 hr capsule TAKE 1 CAPSULE DAILY.  5   No current facility-administered medications for this  visit.  [2] Allergies Allergen Reactions  . Doxycycline Nausea Only  . Sulfamethoxazole-Trimethoprim Other (See Comments)  . Codeine Sulfate GI Intolerance

## 2024-09-09 ENCOUNTER — Encounter: Payer: Self-pay | Admitting: Physical Therapy

## 2024-09-09 ENCOUNTER — Ambulatory Visit: Admitting: Physical Therapy

## 2024-09-09 DIAGNOSIS — R2681 Unsteadiness on feet: Secondary | ICD-10-CM

## 2024-09-09 DIAGNOSIS — M6281 Muscle weakness (generalized): Secondary | ICD-10-CM

## 2024-09-09 DIAGNOSIS — R296 Repeated falls: Secondary | ICD-10-CM

## 2024-09-09 NOTE — Therapy (Signed)
 OUTPATIENT PHYSICAL THERAPY TREATMENT   Patient Name: Tammy Gilbert MRN: [id number] DOB:30-Jun-1946, 78 y.o., female Today's Date: 09/09/2024   END OF SESSION:  PT End of Session - 09/09/24 1017     Visit Number 4    Date for Recertification  11/10/24    Authorization Type [employer]    Progress Note Due on Visit 10    PT Start Time 1017    PT Stop Time 1057    PT Time Calculation (min) 40 min    Activity Tolerance Patient tolerated treatment well    Behavior During Therapy WFL for tasks assessed/performed            Past Medical History:  Diagnosis Date   Arthritis    Breast cancer (HCC)    Diabetes mellitus without complication (HCC)    Diverticulitis    Hypertension    Past Surgical History:  Procedure Laterality Date   ABDOMINAL HYSTERECTOMY     BREAST LUMPECTOMY     right   CHOLECYSTECTOMY     There are no active problems to display for this patient.   PCP: Katina Pfeiffer, PA-C   REFERRING PROVIDER: Katina Pfeiffer, PA-C   REFERRING DIAG: M15.8 (ICD-10-CM) - Other osteoarthritis involving multiple joints - referral for hip pain, balance/strength issues  THERAPY DIAG:  Unsteadiness on feet  Repeated falls  Muscle weakness (generalized)  RATIONALE FOR EVALUATION AND TREATMENT: Rehabilitation  ONSET DATE: 1.5+ years ago, worsening over the last 6-8 months  NEXT MD VISIT: 6 months   SUBJECTIVE:                                                                                                                                                                                                         SUBJECTIVE STATEMENT: Pt reports she feels a little wobbly this morning.  She states her new MD took her off her diabetes medicine this week and not sure if that has anything to do with it.  EVAL: Pt reports she has trouble with all my joints.  Over the summer she had tingling in her legs.  The podiatrist tested her feet and she was  lacking sensation but reports pain in her feet - she is afraid it is neuropathy but does not want to go see the MD about this yet.  She feels like she has been stumbling more recently. She had 2 falls in the past 6 months but several more near misses where she has been able to catch herself.  She denies any dizziness or lightheadedness.  She denies  pain currently but does use CBD oil for her hands, knees and hips.  PAIN: Are you having pain? No  PERTINENT HISTORY:  OA, HTN, HLD, DM-II, diabetic neuropathy, anxiety/depression  PRECAUTIONS: Fall  RED FLAGS: None  WEIGHT BEARING RESTRICTIONS: No  FALLS:  Has patient fallen in last 6 months? Yes. Number of falls 2, 3x in the past year - usually losing her footing outside when working in the garden  LIVING ENVIRONMENT: Lives with: lives alone Lives in: [place] Stairs: Yes: External: 1 steps; on right going up Has following equipment at home: Single point cane and Environmental Consultant - 4 wheeled - *feels like she needs a grab bar installed to get into the tub  OCCUPATION: Retired  PLOF: Independent and Leisure: gardening, walking the dog around the yard, reading, adult coloring books  PATIENT GOALS: To not stumble so bad.   OBJECTIVE: (objective measures completed at initial evaluation unless otherwise dated)  DIAGNOSTIC FINDINGS:  09/20/2022 - DG HIP (WITH OR WITHOUT PELVIS) 2-3V LEFT FINDINGS: There is no evidence of hip fracture or dislocation. No significant arthropathy. No bony lesions or destruction.   IMPRESSION: Negative  PATIENT SURVEYS:  ABC scale: The Activities-Specific Balance Confidence (ABC) Scale 0% 10 20 30  40 50 60 70 80 90 100% No confidence<->completely confident  "How confident are you that you will not lose your balance or become unsteady when you . . .  Date tested 08/18/2024   Walk around the house 90%  2. Walk up or down stairs 30%  3. Bend over and pick up a slipper from in front of a closet floor 80%  4. Reach  for a small can off a shelf at eye level 90%  5. Stand on tip toes and reach for something above your head 90%  6. Stand on a chair and reach for something 0%  7. Sweep the floor 90%  8. Walk outside the house to a car parked in the driveway 90%  9. Get into or out of a car 100%  10. Walk across a parking lot to the mall 60%  11. Walk up or down a ramp 20%  12. Walk in a crowded mall where people rapidly walk past you 20%  13. Are bumped into by people as you walk through the mall 20%  14. Step onto or off of an escalator while you are holding onto the railing 90%  15. Step onto or off an escalator while holding onto parcels such that you cannot hold onto the railing 50%  16. Walk outside on icy sidewalks 0%  Total: #/16 920 / 1600 = 57.5 %  Level of physical functioning: Moderate    LEFS  Extreme difficulty/unable (0), Quite a bit of difficulty (1), Moderate difficulty (2), Little difficulty (3), No difficulty (4) Survey date:  08/18/2024   Any of your usual work, housework or school activities 3  2. Usual hobbies, recreational or sporting activities 3  3. Getting into/out of the bath 3  4. Walking between rooms 4  5. Putting on socks/shoes 2  6. Squatting  0  7. Lifting an object, like a bag of groceries from the floor 3  8. Performing light activities around your home 3  9. Performing heavy activities around your home 2  10. Getting into/out of a car 0  11. Walking 2 blocks 0  12. Walking 1 mile 0  13. Going up/down 10 stairs (1 flight) 0  14. Standing for 1 hour 3  15.  sitting for 1 hour 0  16. Running on even ground 0  17. Running on uneven ground 0  18. Making sharp turns while running fast 0  19. Hopping  0  20. Rolling over in bed 4  Score total:  30 / 80 = 37.5 %  Functional limitation: Moderate     COGNITION: Overall cognitive status: Within functional limits for tasks assessed    SENSATION: Impaired (numbness) in L>R foot, tingling in B LE  POSTURE:   rounded shoulders, forward head, and increased thoracic kyphosis  MUSCLE LENGTH: Hamstrings: Mild tight B ITB: Mild tight B Piriformis: Mild tight B Hip flexors:  Quads:  Heelcord:   LOWER EXTREMITY ROM: Grossly WFL  LOWER EXTREMITY MMT:  MMT Right eval Left eval  Hip flexion 4- 3+  Hip extension 4- in S/L 4- in S/L  Hip abduction 3+ 3  Hip adduction 3- 3-  Hip internal rotation 4- 3+  Hip external rotation 3+ 3+  Knee flexion 4- 4-  Knee extension 4 4  Ankle dorsiflexion 4 4-  Ankle plantarflexion 4 (10 SLS HR) 4- (9 SLS HR)  Ankle inversion    Ankle eversion     (Blank rows = not tested)  FUNCTIONAL TESTS:  5 times sit to stand: 17.18 sec w/o UE assist Timed up and go (TUG): 9.69 sec 10 meter walk test: 7.93 sec Gait speed: 4.14 ft/sec Functional gait assessment: 21/30, 19-24 = medium risk fall  Functional Gait  Assessment  Gait Level Surface Walks 20 ft in less than 5.5 sec, no assistive devices, good speed, no evidence for imbalance, normal gait pattern, deviates no more than 6 in outside of the 12 in walkway width.   Change in Gait Speed Able to smoothly change walking speed without loss of balance or gait deviation. Deviate no more than 6 in outside of the 12 in walkway width.   Gait with Horizontal Head Turns Performs head turns smoothly with slight change in gait velocity (eg, minor disruption to smooth gait path), deviates 6-10 in outside 12 in walkway width, or uses an assistive device.   Gait with Vertical Head Turns Performs head turns with no change in gait. Deviates no more than 6 in outside 12 in walkway width.   Gait and Pivot Turn Pivot turns safely within 3 sec and stops quickly with no loss of balance.   Step Over Obstacle Is able to step over one shoe box (4.5 in total height) without changing gait speed. No evidence of imbalance.   Gait with Narrow Base of Support Ambulates less than 4 steps heel to toe or cannot perform without assistance.   Gait  with Eyes Closed Walks 20 ft, uses assistive device, slower speed, mild gait deviations, deviates 6-10 in outside 12 in walkway width. Ambulates 20 ft in less than 9 sec but greater than 7 sec.   Ambulating Backwards Walks 20 ft, slow speed, abnormal gait pattern, evidence for imbalance, deviates 10-15 in outside 12 in walkway width.   Steps Alternating feet, must use rail.   Total Score 21   FGA comment: 19-24 = medium risk fall       Interpretation of scores: Non-Specific Older Adults Cutoff Score: <=22/30 = risk of falls Parkinson's Disease Cutoff score <15/30= fall risk (Hoehn & Yahr 1-4)  Minimally Clinically Important Difference (MCID)  Stroke (acute, subacute, and chronic) = MDC: 4.2 points Vestibular (acute) = MDC: 6 points Community Dwelling Older Adults =  MCID: 4 points Parkinson's Disease  =  MDC: 4.3 points  (Academy of Neu[employer]ic Physical Therapy (nd). Functional Gait Assessment. Retrieved from https://www.neuropt.org/docs/default-source/cpgs/core-outcome-measures/function-gait-assessment-pocket-guide-proof9-(2).pdf?sfvrsn=b29f35043_0.)  GAIT: Distance walked: clinic distances Assistive device utilized: None Level of assistance: Complete Independence Gait pattern: WFL   TODAY'S TREATMENT:    09/09/24  THERAPEUTIC EXERCISE: To improve strength and endurance.   NuStep - L4 x 6' (UE/LE)  THERAPEUTIC ACTIVITIES: To improve functional performance.  Demonstration, verbal and tactile cues throughout for technique.  5xSTS = 16.75 sec  NEUROMUSCULAR RE-EDUCATION: To improve balance, coordination, kinesthesia, posture, proprioception, and reduce fall risk. Standing with UE support on counter: Standing hip abduction with looped YTB at ankles 2 x 10 B Standing hip extension with looped YTB at ankles 2 x 10 B Standing marching with looped YTB at midfeet 2 x 10 B Corner balance progression with narrow BOS on firm surface with arms crossed on chest            Eyes  open: static stance x 30 sec horiz head turns x 5 vertical head nods x 5 trunk rotation x 5 head diagonals x 5 Eyes closed: static stance x 15 sec horiz head turns x 5 vertical head nods x 5 trunk rotation x 5 head diagonals x 5   09/02/24 NEUROMUSCULAR RE-EDUCATION: To improve coordination, kinesthesia, posture, and proprioception.  Gait with head turns- horizontal, vertical 3x173ft At chair with UE support: Standing heel raises x 10 B Standing hip abduction YTB at ankles x 10 B Standing hip extension YTB at ankles x 10 B Standing marching YTB at ankles x 10 B Squats x 10- cues to hinge hips to avoid knee pain Tandem stance on airex 2x30'  Toe tap clock reach 12 to 6 R/L 4x each- more unsteady going PL, P Sidesteps balance beam 3x with therapist holding hand  THERAPEUTIC EXERCISE: To improve strength, endurance, ROM, and flexibility.  Nustep L4x89min   08/26/24 NEUROMUSCULAR RE-EDUCATION: To improve coordination, kinesthesia, posture, and proprioception.  At counter with UE support: Standing heel raises x 10 B Standing hip abduction x 10 B Standing hip extension x 10 B Standing marching x 10 B TRA sets hooklying 10x3' TRA set with hooklying clam RTB - did not feel much Bridge + TRA RTB x 10 SLR + QS x 10 BLE Tandem gait and backward walking along counter 3x each; added head turns as well  THERAPEUTIC EXERCISE: To improve strength, endurance, ROM, and flexibility.  Nustep L3x5min- fatigue S/L clamshells RTB x 10 BLE S/L reverse clamshells with light manual resistance x 10 RLE; no resistance LLE   08/18/2024 - Eval SELF CARE:  Reviewed eval findings and role of PT in addressing identified deficits as well as instruction in initial HEP (see below).    PATIENT EDUCATION:  Education details: HEP progression - YTB added to standing hip exercises Person educated: Patient Education method: Explanation, Demonstration, Verbal cues, and Handouts Education comprehension:  verbalized understanding, returned demonstration, verbal cues required, and needs further education  HOME EXERCISE PROGRAM: Access Code: [email or web address] URL: [email or web address] Date: 09/09/2024 Prepared by: Elijah Hidden  Exercises - Heel Toe Raises with Counter Support  - 1 x daily - 7 x weekly - 2 sets - 10 reps - 3 sec hold - Supine Bridge with Resistance Band  - 1 x daily - 7 x weekly - 2 sets - 10 reps - Supine Active Straight Leg Raise  - 1 x daily - 7 x weekly - 2 sets - 10 reps - Clamshell with Resistance  - 1 x daily -  7 x weekly - 2 sets - 10 reps - Standing Hip Abduction with Resistance at Ankles and Counter Support  - 1 x daily - 3-4 x weekly - 2 sets - 10 reps - 3 sec hold - Standing Hip Extension with Resistance at Ankles and Counter Support  - 1 x daily - 3-4 x weekly - 2 sets - 10 reps - 3 sec hold - Marching with Resistance  - 1 x daily - 3-4 x weekly - 2 sets - 10 reps - 3 sec hold   ASSESSMENT:  CLINICAL IMPRESSION: Reattempted HEP exercises with addition of looped YTB at ankles with good tolerance other than need for 1 seated rest break, therefore updated HEP to include YTB.  She notes more wobbly feeling with head movements, therefore worked on oncologist with narrow BOS.  Good stability with most patterns with most unsteadiness (no LOB) noted with vertical head motions and pt noting increased hip discomfort associated with fatigue from prolonged standing with narrow BOS.  5xSTS reassessed with only slight decrease in time today.  Lekeya will benefit from continued skilled PT to address ongoing strength and balance deficits to improve mobility and activity tolerance with decreased pain interference and decreased risk for falls.    EVAL: Anquanette Bahner Hippert is a 77 y.o. female who was referred to physical therapy for evaluation and treatment for osteoarthritis of multiple joints with decreased strength and balance.   Patient reports she manages her pain with  CBD oil with no pain reported on eval.  Her biggest concern is worsening unsteadiness on her feet with increased stumbling during gait.  She has had 2 falls in the past 6 months and 3 falls in the past year, with several near misses where she has been able to catch herself.  Patient has deficits in B LE flexibility, B LE strength, abnormal posture, and impaired balance which are interfering with ADLs and are impacting quality of life.  On LEFS patient scored 30/80 demonstrating moderate functional limitation.  ABC scale score of 57.5% indicates a moderate level of physical functioning.  Examination revealed patient is at risk for falls and functional decline as evidenced by the following objective test measures: 5xSTS of 17.18 sec (>15 sec indicates increased risk for falls and decreased BLE power) FGA score of 21/30 indicates a medium risk for falls.  Gait speed 4.14 ft/sec and TUG of 9.69 sec were WFL.  Katlyn will benefit from skilled PT to address above deficits to improve mobility and activity tolerance with decreased pain interference and decreased risk for falls to help reach the maximal level of functional independence and mobility. Patient demonstrates understanding of this POC and is in agreement with this plan.    OBJECTIVE IMPAIRMENTS: Abnormal gait, decreased activity tolerance, decreased balance, decreased knowledge of condition, difficulty walking, decreased strength, decreased safety awareness, impaired flexibility, improper body mechanics, postural dysfunction, and pain.   ACTIVITY LIMITATIONS: bending, standing, squatting, stairs, transfers, and locomotion level  PARTICIPATION LIMITATIONS: community activity and yard work  PERSONAL FACTORS: Fitness, Past/current experiences, Time since onset of injury/illness/exacerbation, and 3+ comorbidities: OA, HTN, HLD, DM-II, diabetic neuropathy, anxiety/depression are also affecting patient's functional outcome.   REHAB POTENTIAL:  Good  CLINICAL DECISION MAKING: Evolving/moderate complexity  EVALUATION COMPLEXITY: Moderate   GOALS: Goals reviewed with patient? Yes  SHORT TERM GOALS: Target date: 09/29/2024  Patient will be independent with initial HEP. Baseline: Initial HEP provided on eval 08/26/24 - just about met, only needed cues with hip extension  Goal status: MET - 09/09/24  2.  Patient will improve 5x STS time to </= 15 seconds to demonstrate improved functional strength and transfer efficiency. Baseline: 17.18 sec Goal status: IN PROGRESS - 09/09/24 - 16.75 sec  LONG TERM GOALS: Target date: 11/10/2024  Patient will be independent with advanced/ongoing HEP to improve outcomes and carryover.  Baseline:  Goal status: INITIAL - 09/09/24 - HEP progressed with addition of YTB to standing hip exercises  2.  Patient will demonstrate improved B LE strength to >/= 4+/5 for improved stability and ease of mobility. Baseline: Refer to above LE MMT table Goal status: INITIAL  3.  Patient will report >/= 40/80 on LEFS (MCID = 9 pts) to demonstrate improved functional ability. Baseline: 30 / 80 = 37.5 % Goal status: INITIAL  4.  Patient will demonstrate at least 25/30 on FGA to decrease risk of falls. Baseline: 21/30 Goal status: INITIAL   5.  Patient will report no further instances of falls.  Baseline: 2 falls in the past 6 months, 3 in the past year Goal status: INITIAL    PLAN:  PT FREQUENCY: 1-2x/week  PT DURATION: 12 weeks  PLANNED INTERVENTIONS: [id number]- PT Re-evaluation, 97750- Physical Performance Testing, 97110-Therapeutic exercises, 97530- Therapeutic activity, [id number]- Neuromuscular re-education, 97535- Self Care, [id number]- Manual therapy, [phone]- Gait training, [phone]- Electrical stimulation (unattended), 97035- Ultrasound, [id number]- Ionotophoresis 4mg /ml Dexamethasone, [id number] (1-2 muscles), [id number] (3+ muscles)- Dry Needling, Patient/Family education, Balance training, Stair training, Taping, Joint  mobilization, Vestibular training, Cryotherapy, and Moist heat  PLAN FOR NEXT SESSION:  progress core and LE strengthening; dynamic balance training, maybe try unsteady surface gait   Elijah CHRISTELLA Hidden, PT 09/09/2024, 11:04 AM

## 2024-09-16 ENCOUNTER — Ambulatory Visit

## 2024-09-16 DIAGNOSIS — M6281 Muscle weakness (generalized): Secondary | ICD-10-CM | POA: Insufficient documentation

## 2024-09-16 DIAGNOSIS — R2681 Unsteadiness on feet: Secondary | ICD-10-CM | POA: Diagnosis present

## 2024-09-16 DIAGNOSIS — R296 Repeated falls: Secondary | ICD-10-CM | POA: Diagnosis present

## 2024-09-16 NOTE — Therapy (Addendum)
 OUTPATIENT PHYSICAL THERAPY TREATMENT   Patient Name: Tammy Gilbert MRN: [id number] DOB:[birth year], 78 y.o., female Today's Date: 09/16/2024   END OF SESSION:  PT End of Session - 09/16/24 1018     Visit Number 5    Date for Recertification  11/10/24    Authorization Type [employer]    Progress Note Due on Visit 10    PT Start Time 0933    PT Stop Time 1017    PT Time Calculation (min) 44 min    Activity Tolerance Patient tolerated treatment well    Behavior During Therapy WFL for tasks assessed/performed             Past Medical History:  Diagnosis Date   Arthritis    Breast cancer (HCC)    Diabetes mellitus without complication (HCC)    Diverticulitis    Hypertension    Past Surgical History:  Procedure Laterality Date   ABDOMINAL HYSTERECTOMY     BREAST LUMPECTOMY     right   CHOLECYSTECTOMY     There are no active problems to display for this patient.   PCP: Katina Pfeiffer, PA-C   REFERRING PROVIDER: Katina Pfeiffer, PA-C   REFERRING DIAG: M15.8 (ICD-10-CM) - Other osteoarthritis involving multiple joints - referral for hip pain, balance/strength issues  THERAPY DIAG:  Unsteadiness on feet  Repeated falls  Muscle weakness (generalized)  RATIONALE FOR EVALUATION AND TREATMENT: Rehabilitation  ONSET DATE: 1.5+ years ago, worsening over the last 6-8 months  NEXT MD VISIT: 6 months   SUBJECTIVE:                                                                                                                                                                                                         SUBJECTIVE STATEMENT: B toes are sore today  EVAL: Pt reports she has trouble with all my joints.  Over the summer she had tingling in her legs.  The podiatrist tested her feet and she was lacking sensation but reports pain in her feet - she is afraid it is neuropathy but does not want to go see the MD about this yet.  She feels like  she has been stumbling more recently. She had 2 falls in the past 6 months but several more near misses where she has been able to catch herself.  She denies any dizziness or lightheadedness.  She denies pain currently but does use CBD oil for her hands, knees and hips.  PAIN: Are you having pain? Yes: NPRS scale: 1/10 Pain location: B toes Pain  description: sore  PERTINENT HISTORY:  OA, HTN, HLD, DM-II, diabetic neuropathy, anxiety/depression  PRECAUTIONS: Fall  RED FLAGS: None  WEIGHT BEARING RESTRICTIONS: No  FALLS:  Has patient fallen in last 6 months? Yes. Number of falls 2, 3x in the past year - usually losing her footing outside when working in the garden  LIVING ENVIRONMENT: Lives with: lives alone Lives in: [place] Stairs: Yes: External: 1 steps; on right going up Has following equipment at home: Single point cane and Environmental Consultant - 4 wheeled - *feels like she needs a grab bar installed to get into the tub  OCCUPATION: Retired  PLOF: Independent and Leisure: gardening, walking the dog around the yard, reading, adult coloring books  PATIENT GOALS: To not stumble so bad.   OBJECTIVE: (objective measures completed at initial evaluation unless otherwise dated)  DIAGNOSTIC FINDINGS:  09/20/2022 - DG HIP (WITH OR WITHOUT PELVIS) 2-3V LEFT FINDINGS: There is no evidence of hip fracture or dislocation. No significant arthropathy. No bony lesions or destruction.   IMPRESSION: Negative  PATIENT SURVEYS:  ABC scale: The Activities-Specific Balance Confidence (ABC) Scale 0% 10 20 30  40 50 60 70 80 90 100% No confidence<->completely confident  "How confident are you that you will not lose your balance or become unsteady when you . . .  Date tested 08/18/2024   Walk around the house 90%  2. Walk up or down stairs 30%  3. Bend over and pick up a slipper from in front of a closet floor 80%  4. Reach for a small can off a shelf at eye level 90%  5. Stand on tip toes and reach for  something above your head 90%  6. Stand on a chair and reach for something 0%  7. Sweep the floor 90%  8. Walk outside the house to a car parked in the driveway 90%  9. Get into or out of a car 100%  10. Walk across a parking lot to the mall 60%  11. Walk up or down a ramp 20%  12. Walk in a crowded mall where people rapidly walk past you 20%  13. Are bumped into by people as you walk through the mall 20%  14. Step onto or off of an escalator while you are holding onto the railing 90%  15. Step onto or off an escalator while holding onto parcels such that you cannot hold onto the railing 50%  16. Walk outside on icy sidewalks 0%  Total: #/16 920 / 1600 = 57.5 %  Level of physical functioning: Moderate    LEFS  Extreme difficulty/unable (0), Quite a bit of difficulty (1), Moderate difficulty (2), Little difficulty (3), No difficulty (4) Survey date:  08/18/2024   Any of your usual work, housework or school activities 3  2. Usual hobbies, recreational or sporting activities 3  3. Getting into/out of the bath 3  4. Walking between rooms 4  5. Putting on socks/shoes 2  6. Squatting  0  7. Lifting an object, like a bag of groceries from the floor 3  8. Performing light activities around your home 3  9. Performing heavy activities around your home 2  10. Getting into/out of a car 0  11. Walking 2 blocks 0  12. Walking 1 mile 0  13. Going up/down 10 stairs (1 flight) 0  14. Standing for 1 hour 3  15.  sitting for 1 hour 0  16. Running on even ground 0  17. Running on uneven ground  0  18. Making sharp turns while running fast 0  19. Hopping  0  20. Rolling over in bed 4  Score total:  30 / 80 = 37.5 %  Functional limitation: Moderate     COGNITION: Overall cognitive status: Within functional limits for tasks assessed    SENSATION: Impaired (numbness) in L>R foot, tingling in B LE  POSTURE:  rounded shoulders, forward head, and increased thoracic kyphosis  MUSCLE  LENGTH: Hamstrings: Mild tight B ITB: Mild tight B Piriformis: Mild tight B Hip flexors:  Quads:  Heelcord:   LOWER EXTREMITY ROM: Grossly WFL  LOWER EXTREMITY MMT:  MMT Right eval Left eval  Hip flexion 4- 3+  Hip extension 4- in S/L 4- in S/L  Hip abduction 3+ 3  Hip adduction 3- 3-  Hip internal rotation 4- 3+  Hip external rotation 3+ 3+  Knee flexion 4- 4-  Knee extension 4 4  Ankle dorsiflexion 4 4-  Ankle plantarflexion 4 (10 SLS HR) 4- (9 SLS HR)  Ankle inversion    Ankle eversion     (Blank rows = not tested)  FUNCTIONAL TESTS:  5 times sit to stand: 17.18 sec w/o UE assist Timed up and go (TUG): 9.69 sec 10 meter walk test: 7.93 sec Gait speed: 4.14 ft/sec Functional gait assessment: 21/30, 19-24 = medium risk fall  Functional Gait  Assessment  Gait Level Surface Walks 20 ft in less than 5.5 sec, no assistive devices, good speed, no evidence for imbalance, normal gait pattern, deviates no more than 6 in outside of the 12 in walkway width.   Change in Gait Speed Able to smoothly change walking speed without loss of balance or gait deviation. Deviate no more than 6 in outside of the 12 in walkway width.   Gait with Horizontal Head Turns Performs head turns smoothly with slight change in gait velocity (eg, minor disruption to smooth gait path), deviates 6-10 in outside 12 in walkway width, or uses an assistive device.   Gait with Vertical Head Turns Performs head turns with no change in gait. Deviates no more than 6 in outside 12 in walkway width.   Gait and Pivot Turn Pivot turns safely within 3 sec and stops quickly with no loss of balance.   Step Over Obstacle Is able to step over one shoe box (4.5 in total height) without changing gait speed. No evidence of imbalance.   Gait with Narrow Base of Support Ambulates less than 4 steps heel to toe or cannot perform without assistance.   Gait with Eyes Closed Walks 20 ft, uses assistive device, slower speed, mild gait  deviations, deviates 6-10 in outside 12 in walkway width. Ambulates 20 ft in less than 9 sec but greater than 7 sec.   Ambulating Backwards Walks 20 ft, slow speed, abnormal gait pattern, evidence for imbalance, deviates 10-15 in outside 12 in walkway width.   Steps Alternating feet, must use rail.   Total Score 21   FGA comment: 19-24 = medium risk fall       Interpretation of scores: Non-Specific Older Adults Cutoff Score: <=22/30 = risk of falls Parkinson's Disease Cutoff score <15/30= fall risk (Hoehn & Yahr 1-4)  Minimally Clinically Important Difference (MCID)  Stroke (acute, subacute, and chronic) = MDC: 4.2 points Vestibular (acute) = MDC: 6 points Community Dwelling Older Adults =  MCID: 4 points Parkinson's Disease  =  MDC: 4.3 points  (Academy of Neurologic Physical Therapy (nd). Functional Gait Assessment. Retrieved from  https://www.neuropt.org/docs/default-source/cpgs/core-outcome-measures/function-gait-assessment-pocket-guide-proof9-(2).pdf?sfvrsn=b69f35043_0.)  GAIT: Distance walked: clinic distances Assistive device utilized: None Level of assistance: Complete Independence Gait pattern: WFL   TODAY'S TREATMENT:  09/16/24 THERAPEUTIC EXERCISE: To improve strength, endurance, ROM, and flexibility.  Bike L2x79min Leg curl 15lb 2x10 BLE Leg ext 5lb 2x10 BLE Gastroc stretch on 1/2 foam roll x 30' B  NEUROMUSCULAR RE-EDUCATION: To improve coordination, kinesthesia, posture, and proprioception.  Gait with head turns- horizontal, vertical; direction changes 2x158ft- veered to R side when turning to R with UE support standing on airex pad: Standing marching on airex x 10 B Standing head turns x 5 B trunk rotation x 5 B Squats x 10 - knee pain   Standing in corner: EC head turns x 5 EC trunk rotations x 5 Head nods with EC x 5  09/09/24  THERAPEUTIC EXERCISE: To improve strength and endurance.   NuStep - L4 x 6' (UE/LE)  THERAPEUTIC ACTIVITIES: To improve  functional performance.  Demonstration, verbal and tactile cues throughout for technique.  5xSTS = 16.75 sec  NEUROMUSCULAR RE-EDUCATION: To improve balance, coordination, kinesthesia, posture, proprioception, and reduce fall risk. Standing with UE support on counter: Standing hip abduction with looped YTB at ankles 2 x 10 B Standing hip extension with looped YTB at ankles 2 x 10 B Standing marching with looped YTB at midfeet 2 x 10 B Corner balance progression with narrow BOS on firm surface with arms crossed on chest            Eyes open: static stance x 30 sec horiz head turns x 5 vertical head nods x 5 trunk rotation x 5 head diagonals x 5 Eyes closed: static stance x 15 sec horiz head turns x 5 vertical head nods x 5 trunk rotation x 5 head diagonals x 5   09/02/24 NEUROMUSCULAR RE-EDUCATION: To improve coordination, kinesthesia, posture, and proprioception.  Gait with head turns- horizontal, vertical 3x11ft At chair with UE support: Standing heel raises x 10 B Standing hip abduction YTB at ankles x 10 B Standing hip extension YTB at ankles x 10 B Standing marching YTB at ankles x 10 B Squats x 10- cues to hinge hips to avoid knee pain Tandem stance on airex 2x30'  Toe tap clock reach 12 to 6 R/L 4x each- more unsteady going PL, P Sidesteps balance beam 3x with therapist holding hand  THERAPEUTIC EXERCISE: To improve strength, endurance, ROM, and flexibility.  Nustep L4x24min   08/26/24 NEUROMUSCULAR RE-EDUCATION: To improve coordination, kinesthesia, posture, and proprioception.  At counter with UE support: Standing heel raises x 10 B Standing hip abduction x 10 B Standing hip extension x 10 B Standing marching x 10 B TRA sets hooklying 10x3' TRA set with hooklying clam RTB - did not feel much Bridge + TRA RTB x 10 SLR + QS x 10 BLE Tandem gait and backward walking along counter 3x each; added head turns as well  THERAPEUTIC EXERCISE: To improve strength,  endurance, ROM, and flexibility.  Nustep L3x5min- fatigue S/L clamshells RTB x 10 BLE S/L reverse clamshells with light manual resistance x 10 RLE; no resistance LLE   08/18/2024 - Eval SELF CARE:  Reviewed eval findings and role of PT in addressing identified deficits as well as instruction in initial HEP (see below).    PATIENT EDUCATION:  Education details: HEP progression - YTB added to standing hip exercises Person educated: Patient Education method: Explanation, Demonstration, Verbal cues, and Handouts Education comprehension: verbalized understanding, returned demonstration, verbal cues required, and needs further  education  HOME EXERCISE PROGRAM: Access Code: [email or web address] URL: [email or web address] Date: 09/16/2024 Prepared by: Lezly Rumpf  Exercises - Heel Toe Raises with Counter Support  - 1 x daily - 7 x weekly - 2 sets - 10 reps - 3 sec hold - Supine Bridge with Resistance Band  - 1 x daily - 7 x weekly - 2 sets - 10 reps - Supine Active Straight Leg Raise  - 1 x daily - 7 x weekly - 2 sets - 10 reps - Clamshell with Resistance  - 1 x daily - 7 x weekly - 2 sets - 10 reps - Standing Hip Abduction with Resistance at Ankles and Counter Support  - 1 x daily - 3-4 x weekly - 2 sets - 10 reps - 3 sec hold - Standing Hip Extension with Resistance at Ankles and Counter Support  - 1 x daily - 3-4 x weekly - 2 sets - 10 reps - 3 sec hold - Marching with Resistance  - 1 x daily - 3-4 x weekly - 2 sets - 10 reps - 3 sec hold - Standing Balance in Corner with Eyes Closed  - 1 x daily - 7 x weekly - 1 sets - 10 reps - Standing with Head Rotation  - 1 x daily - 7 x weekly - 1 sets - 10 reps - Standing with Head Nod  - 1 x daily - 7 x weekly - 1 sets - 10 reps   ASSESSMENT:  CLINICAL IMPRESSION: Continued working on balance activities to reduce risk for falls. Her knees started bothering her after the squats so we worked on the leg ext/curl machine. Good response to  treatment but she was pretty tired out from the weight machine. Progressing toward goals. Tammy Gilbert will benefit from continued skilled PT to address ongoing strength and balance deficits to improve mobility and activity tolerance with decreased pain interference and decreased risk for falls.    EVAL: Tammy Gilbert is a 78 y.o. female who was referred to physical therapy for evaluation and treatment for osteoarthritis of multiple joints with decreased strength and balance.   Patient reports she manages her pain with CBD oil with no pain reported on eval.  Her biggest concern is worsening unsteadiness on her feet with increased stumbling during gait.  She has had 2 falls in the past 6 months and 3 falls in the past year, with several near misses where she has been able to catch herself.  Patient has deficits in B LE flexibility, B LE strength, abnormal posture, and impaired balance which are interfering with ADLs and are impacting quality of life.  On LEFS patient scored 30/80 demonstrating moderate functional limitation.  ABC scale score of 57.5% indicates a moderate level of physical functioning.  Examination revealed patient is at risk for falls and functional decline as evidenced by the following objective test measures: 5xSTS of 17.18 sec (>15 sec indicates increased risk for falls and decreased BLE power) FGA score of 21/30 indicates a medium risk for falls.  Gait speed 4.14 ft/sec and TUG of 9.69 sec were WFL.  Tammy Gilbert will benefit from skilled PT to address above deficits to improve mobility and activity tolerance with decreased pain interference and decreased risk for falls to help reach the maximal level of functional independence and mobility. Patient demonstrates understanding of this POC and is in agreement with this plan.    OBJECTIVE IMPAIRMENTS: Abnormal gait, decreased activity tolerance, decreased balance, decreased knowledge of condition, difficulty walking, decreased  strength, decreased  safety awareness, impaired flexibility, improper body mechanics, postural dysfunction, and pain.   ACTIVITY LIMITATIONS: bending, standing, squatting, stairs, transfers, and locomotion level  PARTICIPATION LIMITATIONS: community activity and yard work  PERSONAL FACTORS: Fitness, Past/current experiences, Time since onset of injury/illness/exacerbation, and 3+ comorbidities: OA, HTN, HLD, DM-II, diabetic neuropathy, anxiety/depression are also affecting patient's functional outcome.   REHAB POTENTIAL: Good  CLINICAL DECISION MAKING: Evolving/moderate complexity  EVALUATION COMPLEXITY: Moderate   GOALS: Goals reviewed with patient? Yes  SHORT TERM GOALS: Target date: 09/29/2024  Patient will be independent with initial HEP. Baseline: Initial HEP provided on eval 08/26/24 - just about met, only needed cues with hip extension Goal status: MET - 09/09/24  2.  Patient will improve 5x STS time to </= 15 seconds to demonstrate improved functional strength and transfer efficiency. Baseline: 17.18 sec Goal status: IN PROGRESS - 09/09/24 - 16.75 sec  LONG TERM GOALS: Target date: 11/10/2024  Patient will be independent with advanced/ongoing HEP to improve outcomes and carryover.  Baseline:  Goal status: INITIAL - 09/09/24 - HEP progressed with addition of YTB to standing hip exercises  2.  Patient will demonstrate improved B LE strength to >/= 4+/5 for improved stability and ease of mobility. Baseline: Refer to above LE MMT table Goal status: INITIAL  3.  Patient will report >/= 40/80 on LEFS (MCID = 9 pts) to demonstrate improved functional ability. Baseline: 30 / 80 = 37.5 % Goal status: MET 09/16/24 45 / 80 = 56.3 %  4.  Patient will demonstrate at least 25/30 on FGA to decrease risk of falls. Baseline: 21/30 Goal status: INITIAL   5.  Patient will report no further instances of falls.  Baseline: 2 falls in the past 6 months, 3 in the past year Goal status: IN PROGRESS- 09/16/24  no falls thus far    PLAN:  PT FREQUENCY: 1-2x/week  PT DURATION: 12 weeks  PLANNED INTERVENTIONS: 97164- PT Re-evaluation, 97750- Physical Performance Testing, 97110-Therapeutic exercises, 97530- Therapeutic activity, 97112- Neuromuscular re-education, 97535- Self Care, [id number]- Manual therapy, [phone]- Gait training, [phone]- Electrical stimulation (unattended), 97035- Ultrasound, 02966- Ionotophoresis 4mg /ml Dexamethasone, 79439 (1-2 muscles), 20561 (3+ muscles)- Dry Needling, Patient/Family education, Balance training, Stair training, Taping, Joint mobilization, Vestibular training, Cryotherapy, and Moist heat  PLAN FOR NEXT SESSION:  progress core and LE strengthening; dynamic balance training, maybe try unsteady surface gait   Hodari Chuba L Fedrick Cefalu, PTA 09/16/2024, 10:28 AM

## 2024-09-24 ENCOUNTER — Ambulatory Visit

## 2024-09-24 DIAGNOSIS — R2681 Unsteadiness on feet: Secondary | ICD-10-CM

## 2024-09-24 DIAGNOSIS — M6281 Muscle weakness (generalized): Secondary | ICD-10-CM

## 2024-09-24 DIAGNOSIS — R296 Repeated falls: Secondary | ICD-10-CM

## 2024-09-24 NOTE — Therapy (Signed)
 OUTPATIENT PHYSICAL THERAPY TREATMENT   Patient Name: Tammy Gilbert MRN: [id number] DOB:12/02/1945, 78 y.o., female Today's Date: 09/24/2024   END OF SESSION:  PT End of Session - 09/24/24 0932     Visit Number 6    Date for Recertification  11/10/24    Authorization Type [employer]    Progress Note Due on Visit 10    PT Start Time 0927    PT Stop Time 1012    PT Time Calculation (min) 45 min    Activity Tolerance Patient tolerated treatment well    Behavior During Therapy WFL for tasks assessed/performed              Past Medical History:  Diagnosis Date   Arthritis    Breast cancer (HCC)    Diabetes mellitus without complication (HCC)    Diverticulitis    Hypertension    Past Surgical History:  Procedure Laterality Date   ABDOMINAL HYSTERECTOMY     BREAST LUMPECTOMY     right   CHOLECYSTECTOMY     There are no active problems to display for this patient.   PCP: Katina Pfeiffer, PA-C   REFERRING PROVIDER: Katina Pfeiffer, PA-C   REFERRING DIAG: M15.8 (ICD-10-CM) - Other osteoarthritis involving multiple joints - referral for hip pain, balance/strength issues  THERAPY DIAG:  Unsteadiness on feet  Repeated falls  Muscle weakness (generalized)  RATIONALE FOR EVALUATION AND TREATMENT: Rehabilitation  ONSET DATE: 1.5+ years ago, worsening over the last 6-8 months  NEXT MD VISIT: 6 months   SUBJECTIVE:                                                                                                                                                                                                         SUBJECTIVE STATEMENT: Pt reports LBP very mild but it's there, had a stumble yesterday, just turned around and lost balance  EVAL: Pt reports she has trouble with all my joints.  Over the summer she had tingling in her legs.  The podiatrist tested her feet and she was lacking sensation but reports pain in her feet - she is afraid it  is neuropathy but does not want to go see the MD about this yet.  She feels like she has been stumbling more recently. She had 2 falls in the past 6 months but several more near misses where she has been able to catch herself.  She denies any dizziness or lightheadedness.  She denies pain currently but does use CBD oil for her hands, knees and hips.  PAIN: Are you having pain? Yes: NPRS scale: 1/10 Pain location: low back  Pain description: sore  PERTINENT HISTORY:  OA, HTN, HLD, DM-II, diabetic neuropathy, anxiety/depression  PRECAUTIONS: Fall  RED FLAGS: None  WEIGHT BEARING RESTRICTIONS: No  FALLS:  Has patient fallen in last 6 months? Yes. Number of falls 2, 3x in the past year - usually losing her footing outside when working in the garden  LIVING ENVIRONMENT: Lives with: lives alone Lives in: [place] Stairs: Yes: External: 1 steps; on right going up Has following equipment at home: Single point cane and Environmental Consultant - 4 wheeled - *feels like she needs a grab bar installed to get into the tub  OCCUPATION: Retired  PLOF: Independent and Leisure: gardening, walking the dog around the yard, reading, adult coloring books  PATIENT GOALS: To not stumble so bad.   OBJECTIVE: (objective measures completed at initial evaluation unless otherwise dated)  DIAGNOSTIC FINDINGS:  09/20/2022 - DG HIP (WITH OR WITHOUT PELVIS) 2-3V LEFT FINDINGS: There is no evidence of hip fracture or dislocation. No significant arthropathy. No bony lesions or destruction.   IMPRESSION: Negative  PATIENT SURVEYS:  ABC scale: The Activities-Specific Balance Confidence (ABC) Scale 0% 10 20 30  40 50 60 70 80 90 100% No confidence<->completely confident  How confident are you that you will not lose your balance or become unsteady when you . . .  Date tested 08/18/2024   Walk around the house 90%  2. Walk up or down stairs 30%  3. Bend over and pick up a slipper from in front of a closet floor 80%  4. Reach  for a small can off a shelf at eye level 90%  5. Stand on tip toes and reach for something above your head 90%  6. Stand on a chair and reach for something 0%  7. Sweep the floor 90%  8. Walk outside the house to a car parked in the driveway 90%  9. Get into or out of a car 100%  10. Walk across a parking lot to the mall 60%  11. Walk up or down a ramp 20%  12. Walk in a crowded mall where people rapidly walk past you 20%  13. Are bumped into by people as you walk through the mall 20%  14. Step onto or off of an escalator while you are holding onto the railing 90%  15. Step onto or off an escalator while holding onto parcels such that you cannot hold onto the railing 50%  16. Walk outside on icy sidewalks 0%  Total: #/16 920 / 1600 = 57.5 %  Level of physical functioning: Moderate    LEFS  Extreme difficulty/unable (0), Quite a bit of difficulty (1), Moderate difficulty (2), Little difficulty (3), No difficulty (4) Survey date:  08/18/2024   Any of your usual work, housework or school activities 3  2. Usual hobbies, recreational or sporting activities 3  3. Getting into/out of the bath 3  4. Walking between rooms 4  5. Putting on socks/shoes 2  6. Squatting  0  7. Lifting an object, like a bag of groceries from the floor 3  8. Performing light activities around your home 3  9. Performing heavy activities around your home 2  10. Getting into/out of a car 0  11. Walking 2 blocks 0  12. Walking 1 mile 0  13. Going up/down 10 stairs (1 flight) 0  14. Standing for 1 hour 3  15.  sitting for 1  hour 0  16. Running on even ground 0  17. Running on uneven ground 0  18. Making sharp turns while running fast 0  19. Hopping  0  20. Rolling over in bed 4  Score total:  30 / 80 = 37.5 %  Functional limitation: Moderate     COGNITION: Overall cognitive status: Within functional limits for tasks assessed    SENSATION: Impaired (numbness) in L>R foot, tingling in B LE  POSTURE:   rounded shoulders, forward head, and increased thoracic kyphosis  MUSCLE LENGTH: Hamstrings: Mild tight B ITB: Mild tight B Piriformis: Mild tight B Hip flexors:  Quads:  Heelcord:   LOWER EXTREMITY ROM: Grossly WFL  LOWER EXTREMITY MMT:  MMT Right eval Left eval  Hip flexion 4- 3+  Hip extension 4- in S/L 4- in S/L  Hip abduction 3+ 3  Hip adduction 3- 3-  Hip internal rotation 4- 3+  Hip external rotation 3+ 3+  Knee flexion 4- 4-  Knee extension 4 4  Ankle dorsiflexion 4 4-  Ankle plantarflexion 4 (10 SLS HR) 4- (9 SLS HR)  Ankle inversion    Ankle eversion     (Blank rows = not tested)  FUNCTIONAL TESTS:  5 times sit to stand: 17.18 sec w/o UE assist Timed up and go (TUG): 9.69 sec 10 meter walk test: 7.93 sec Gait speed: 4.14 ft/sec Functional gait assessment: 21/30, 19-24 = medium risk fall  Functional Gait  Assessment  Gait Level Surface Walks 20 ft in less than 5.5 sec, no assistive devices, good speed, no evidence for imbalance, normal gait pattern, deviates no more than 6 in outside of the 12 in walkway width.   Change in Gait Speed Able to smoothly change walking speed without loss of balance or gait deviation. Deviate no more than 6 in outside of the 12 in walkway width.   Gait with Horizontal Head Turns Performs head turns smoothly with slight change in gait velocity (eg, minor disruption to smooth gait path), deviates 6-10 in outside 12 in walkway width, or uses an assistive device.   Gait with Vertical Head Turns Performs head turns with no change in gait. Deviates no more than 6 in outside 12 in walkway width.   Gait and Pivot Turn Pivot turns safely within 3 sec and stops quickly with no loss of balance.   Step Over Obstacle Is able to step over one shoe box (4.5 in total height) without changing gait speed. No evidence of imbalance.   Gait with Narrow Base of Support Ambulates less than 4 steps heel to toe or cannot perform without assistance.   Gait  with Eyes Closed Walks 20 ft, uses assistive device, slower speed, mild gait deviations, deviates 6-10 in outside 12 in walkway width. Ambulates 20 ft in less than 9 sec but greater than 7 sec.   Ambulating Backwards Walks 20 ft, slow speed, abnormal gait pattern, evidence for imbalance, deviates 10-15 in outside 12 in walkway width.   Steps Alternating feet, must use rail.   Total Score 21   FGA comment: 19-24 = medium risk fall       Interpretation of scores: Non-Specific Older Adults Cutoff Score: <=22/30 = risk of falls Parkinsons Disease Cutoff score <15/30= fall risk (Hoehn & Yahr 1-4)  Minimally Clinically Important Difference (MCID)  Stroke (acute, subacute, and chronic) = MDC: 4.2 points Vestibular (acute) = MDC: 6 points Community Dwelling Older Adults =  MCID: 4 points Parkinsons Disease  =  MDC: 4.3 points  (Academy of Neu[employer]ic Physical Therapy (nd). Functional Gait Assessment. Retrieved from https://www.neuropt.org/docs/default-source/cpgs/core-outcome-measures/function-gait-assessment-pocket-guide-proof9-(2).pdf?sfvrsn=b65f35043_0.)  GAIT: Distance walked: clinic distances Assistive device utilized: None Level of assistance: Complete Independence Gait pattern: WFL   TODAY'S TREATMENT:  09/24/24 THERAPEUTIC EXERCISE: To improve strength, endurance, ROM, and flexibility.  Nustep L5x2min UE/LE Leg curls 15lb 2x15 BLE Leg extension 5lb x15; x 10  BLE  NEUROMUSCULAR RE-EDUCATION: To improve coordination, kinesthesia, posture, and proprioception.   [facility] PT Assessment - 09/24/24 0001       Functional Gait  Assessment   Gait Level Surface Walks 20 ft in less than 5.5 sec, no assistive devices, good speed, no evidence for imbalance, normal gait pattern, deviates no more than 6 in outside of the 12 in walkway width.    Change in Gait Speed Able to smoothly change walking speed without loss of balance or gait deviation. Deviate no more than 6 in outside of the 12 in  walkway width.    Gait with Horizontal Head Turns Performs head turns smoothly with no change in gait. Deviates no more than 6 in outside 12 in walkway width    Gait with Vertical Head Turns Performs head turns with no change in gait. Deviates no more than 6 in outside 12 in walkway width.    Gait and Pivot Turn Pivot turns safely within 3 sec and stops quickly with no loss of balance.    Step Over Obstacle Is able to step over one shoe box (4.5 in total height) without changing gait speed. No evidence of imbalance.    Gait with Narrow Base of Support Ambulates 7-9 steps.    Gait with Eyes Closed Walks 20 ft, uses assistive device, slower speed, mild gait deviations, deviates 6-10 in outside 12 in walkway width. Ambulates 20 ft in less than 9 sec but greater than 7 sec.    Ambulating Backwards Walks 20 ft, uses assistive device, slower speed, mild gait deviations, deviates 6-10 in outside 12 in walkway width.    Steps Alternating feet, must use rail.    Total Score 25         SLS + toe tap 12 to 6 o'clock x 5 each side with UE support Standing hip abduction RTB at ankles x 10 B Standing hip extension bent over RTB at ankles x 10 B Standing marching RTB at forefoot x 10 B  MT--- STM to L lateral patellar tendon- TTP over this area, pain subsided as well  09/16/24 THERAPEUTIC EXERCISE: To improve strength, endurance, ROM, and flexibility.  Bike L2x71min Leg curl 15lb 2x10 BLE Leg ext 5lb 2x10 BLE Gastroc stretch on 1/2 foam roll x 30' B  NEUROMUSCULAR RE-EDUCATION: To improve coordination, kinesthesia, posture, and proprioception.  Gait with head turns- horizontal, vertical; direction changes 2x149ft- veered to R side when turning to R with UE support standing on airex pad: Standing marching on airex x 10 B Standing head turns x 5 B trunk rotation x 5 B Squats x 10 - knee pain   Standing in corner: EC head turns x 5 EC trunk rotations x 5 Head nods with EC x 5  09/09/24   THERAPEUTIC EXERCISE: To improve strength and endurance.   NuStep - L4 x 6' (UE/LE)  THERAPEUTIC ACTIVITIES: To improve functional performance.  Demonstration, verbal and tactile cues throughout for technique.  5xSTS = 16.75 sec  NEUROMUSCULAR RE-EDUCATION: To improve balance, coordination, kinesthesia, posture, proprioception, and reduce fall risk. Standing with UE support on counter: Standing hip  abduction with looped YTB at ankles 2 x 10 B Standing hip extension with looped YTB at ankles 2 x 10 B Standing marching with looped YTB at midfeet 2 x 10 B Corner balance progression with narrow BOS on firm surface with arms crossed on chest            Eyes open: static stance x 30 sec horiz head turns x 5 vertical head nods x 5 trunk rotation x 5 head diagonals x 5 Eyes closed: static stance x 15 sec horiz head turns x 5 vertical head nods x 5 trunk rotation x 5 head diagonals x 5   09/02/24 NEUROMUSCULAR RE-EDUCATION: To improve coordination, kinesthesia, posture, and proprioception.  Gait with head turns- horizontal, vertical 3x186ft At chair with UE support: Standing heel raises x 10 B Standing hip abduction YTB at ankles x 10 B Standing hip extension YTB at ankles x 10 B Standing marching YTB at ankles x 10 B Squats x 10- cues to hinge hips to avoid knee pain Tandem stance on airex 2x30'  Toe tap clock reach 12 to 6 R/L 4x each- more unsteady going PL, P Sidesteps balance beam 3x with therapist holding hand  THERAPEUTIC EXERCISE: To improve strength, endurance, ROM, and flexibility.  Nustep L4x10min   08/26/24 NEUROMUSCULAR RE-EDUCATION: To improve coordination, kinesthesia, posture, and proprioception.  At counter with UE support: Standing heel raises x 10 B Standing hip abduction x 10 B Standing hip extension x 10 B Standing marching x 10 B TRA sets hooklying 10x3' TRA set with hooklying clam RTB - did not feel much Bridge + TRA RTB x 10 SLR + QS x 10  BLE Tandem gait and backward walking along counter 3x each; added head turns as well  THERAPEUTIC EXERCISE: To improve strength, endurance, ROM, and flexibility.  Nustep L3x5min- fatigue S/L clamshells RTB x 10 BLE S/L reverse clamshells with light manual resistance x 10 RLE; no resistance LLE   08/18/2024 - Eval SELF CARE:  Reviewed eval findings and role of PT in addressing identified deficits as well as instruction in initial HEP (see below).    PATIENT EDUCATION:  Education details: HEP progression - RTB added to standing hip exercises Person educated: Patient Education method: Explanation, Demonstration, Verbal cues, and Handouts Education comprehension: verbalized understanding, returned demonstration, verbal cues required, and needs further education  HOME EXERCISE PROGRAM: Access Code: [email or web address] URL: [email or web address] Date: 09/16/2024 Prepared by: Mallorey Odonell  Exercises - Heel Toe Raises with Counter Support  - 1 x daily - 7 x weekly - 2 sets - 10 reps - 3 sec hold - Supine Bridge with Resistance Band  - 1 x daily - 7 x weekly - 2 sets - 10 reps - Supine Active Straight Leg Raise  - 1 x daily - 7 x weekly - 2 sets - 10 reps - Clamshell with Resistance  - 1 x daily - 7 x weekly - 2 sets - 10 reps - Standing Hip Abduction with Resistance at Ankles and Counter Support  - 1 x daily - 3-4 x weekly - 2 sets - 10 reps - 3 sec hold - Standing Hip Extension with Resistance at Ankles and Counter Support  - 1 x daily - 3-4 x weekly - 2 sets - 10 reps - 3 sec hold - Marching with Resistance  - 1 x daily - 3-4 x weekly - 2 sets - 10 reps - 3 sec hold - Standing Balance in Corner with Eyes Closed  -  1 x daily - 7 x weekly - 1 sets - 10 reps - Standing with Head Rotation  - 1 x daily - 7 x weekly - 1 sets - 10 reps - Standing with Head Nod  - 1 x daily - 7 x weekly - 1 sets - 10 reps   ASSESSMENT:  CLINICAL IMPRESSION: Reassessed FGA today, pt has met goal for this  scoring 25/30, which removes her from fall risk category. We continued to progress strengthening exercises, she did require cues for the desired movement with standing hip exercises. The leg extensions were challenging for her, noted L lateral knee pain after final set of leg ext. Knee pain subsided towards the end of session after gentle STM. Zykia will benefit from continued skilled PT to address ongoing strength and balance deficits to improve mobility and activity tolerance with decreased pain interference and decreased risk for falls.    EVAL: Tammy Gilbert is a 78 y.o. female who was referred to physical therapy for evaluation and treatment for osteoarthritis of multiple joints with decreased strength and balance.   Patient reports she manages her pain with CBD oil with no pain reported on eval.  Her biggest concern is worsening unsteadiness on her feet with increased stumbling during gait.  She has had 2 falls in the past 6 months and 3 falls in the past year, with several near misses where she has been able to catch herself.  Patient has deficits in B LE flexibility, B LE strength, abnormal posture, and impaired balance which are interfering with ADLs and are impacting quality of life.  On LEFS patient scored 30/80 demonstrating moderate functional limitation.  ABC scale score of 57.5% indicates a moderate level of physical functioning.  Examination revealed patient is at risk for falls and functional decline as evidenced by the following objective test measures: 5xSTS of 17.18 sec (>15 sec indicates increased risk for falls and decreased BLE power) FGA score of 21/30 indicates a medium risk for falls.  Gait speed 4.14 ft/sec and TUG of 9.69 sec were WFL.  Adeola will benefit from skilled PT to address above deficits to improve mobility and activity tolerance with decreased pain interference and decreased risk for falls to help reach the maximal level of functional independence and mobility.  Patient demonstrates understanding of this POC and is in agreement with this plan.    OBJECTIVE IMPAIRMENTS: Abnormal gait, decreased activity tolerance, decreased balance, decreased knowledge of condition, difficulty walking, decreased strength, decreased safety awareness, impaired flexibility, improper body mechanics, postural dysfunction, and pain.   ACTIVITY LIMITATIONS: bending, standing, squatting, stairs, transfers, and locomotion level  PARTICIPATION LIMITATIONS: community activity and yard work  PERSONAL FACTORS: Fitness, Past/current experiences, Time since onset of injury/illness/exacerbation, and 3+ comorbidities: OA, HTN, HLD, DM-II, diabetic neuropathy, anxiety/depression are also affecting patient's functional outcome.   REHAB POTENTIAL: Good  CLINICAL DECISION MAKING: Evolving/moderate complexity  EVALUATION COMPLEXITY: Moderate   GOALS: Goals reviewed with patient? Yes  SHORT TERM GOALS: Target date: 09/29/2024  Patient will be independent with initial HEP. Baseline: Initial HEP provided on eval 08/26/24 - just about met, only needed cues with hip extension Goal status: MET - 09/09/24  2.  Patient will improve 5x STS time to </= 15 seconds to demonstrate improved functional strength and transfer efficiency. Baseline: 17.18 sec Goal status: MET - 09/24/24- 13.84 seconds w/o UE support  LONG TERM GOALS: Target date: 11/10/2024  Patient will be independent with advanced/ongoing HEP to improve outcomes and carryover.  Baseline:  Goal status: INITIAL - 09/09/24 - HEP progressed with addition of YTB to standing hip exercises  2.  Patient will demonstrate improved B LE strength to >/= 4+/5 for improved stability and ease of mobility. Baseline: Refer to above LE MMT table Goal status: INITIAL  3.  Patient will report >/= 40/80 on LEFS (MCID = 9 pts) to demonstrate improved functional ability. Baseline: 30 / 80 = 37.5 % Goal status: MET 09/16/24 45 / 80 = 56.3  %  4.  Patient will demonstrate at least 25/30 on FGA to decrease risk of falls. Baseline: 21/30 Goal status: MET- 09/24/24  25/30  5.  Patient will report no further instances of falls.  Baseline: 2 falls in the past 6 months, 3 in the past year Goal status: IN PROGRESS- 09/16/24 no falls thus far    PLAN:  PT FREQUENCY: 1-2x/week  PT DURATION: 12 weeks  PLANNED INTERVENTIONS: [id number]- PT Re-evaluation, 97750- Physical Performance Testing, 97110-Therapeutic exercises, 97530- Therapeutic activity, 97112- Neuromuscular re-education, 97535- Self Care, [id number]- Manual therapy, [phone]- Gait training, [phone]- Electrical stimulation (unattended), 97035- Ultrasound, 02966- Ionotophoresis 4mg /ml Dexamethasone, 79439 (1-2 muscles), [id number] (3+ muscles)- Dry Needling, Patient/Family education, Balance training, Stair training, Taping, Joint mobilization, Vestibular training, Cryotherapy, and Moist heat  PLAN FOR NEXT SESSION:  progress core and LE strengthening; dynamic balance training, maybe try unsteady surface gait   Angalena Cousineau L Ikeem Cleckler, PTA 09/24/2024, 10:13 AM

## 2024-09-30 ENCOUNTER — Ambulatory Visit

## 2024-10-01 NOTE — Telephone Encounter (Signed)
 Called and reviewed below message with pt. Expressed understanding to all. Is been having some diarrhea for 2 days but no other symptoms. No pain, discomfort or nausea. Will call if symptoms worsen or change.

## 2024-10-06 ENCOUNTER — Encounter: Payer: Self-pay | Admitting: Physical Therapy

## 2024-10-06 ENCOUNTER — Ambulatory Visit: Admitting: Physical Therapy

## 2024-10-06 DIAGNOSIS — R296 Repeated falls: Secondary | ICD-10-CM

## 2024-10-06 DIAGNOSIS — M6281 Muscle weakness (generalized): Secondary | ICD-10-CM

## 2024-10-06 DIAGNOSIS — R2681 Unsteadiness on feet: Secondary | ICD-10-CM

## 2024-10-06 NOTE — Therapy (Signed)
 " OUTPATIENT PHYSICAL THERAPY TREATMENT   Patient Name: Tammy Gilbert MRN: [id number] DOB:[birth year], 78 y.o., female Today's Date: 10/06/2024   END OF SESSION:  PT End of Session - 10/06/24 0932     Visit Number 7    Date for Recertification  11/10/24    Authorization Type [employer]    Progress Note Due on Visit 10    PT Start Time 0932    PT Stop Time 1013    PT Time Calculation (min) 41 min    Activity Tolerance Patient tolerated treatment well    Behavior During Therapy WFL for tasks assessed/performed               Past Medical History:  Diagnosis Date   Arthritis    Breast cancer (HCC)    Diabetes mellitus without complication (HCC)    Diverticulitis    Hypertension    Past Surgical History:  Procedure Laterality Date   ABDOMINAL HYSTERECTOMY     BREAST LUMPECTOMY     right   CHOLECYSTECTOMY     There are no active problems to display for this patient.   PCP: Katina Pfeiffer, PA-C   REFERRING PROVIDER: Katina Pfeiffer, PA-C   REFERRING DIAG: M15.8 (ICD-10-CM) - Other osteoarthritis involving multiple joints - referral for hip pain, balance/strength issues  THERAPY DIAG:  Unsteadiness on feet  Repeated falls  Muscle weakness (generalized)  RATIONALE FOR EVALUATION AND TREATMENT: Rehabilitation  ONSET DATE: 1.5+ years ago, worsening over the last 6-8 months  NEXT MD VISIT: 6 months   SUBJECTIVE:                                                                                                                                                                                                         SUBJECTIVE STATEMENT: Pt reports her low back has been hurting intermittently up to 5/10, but no pain currently. She states she has been feeling better with her walking.  EVAL: Pt reports she has trouble with all my joints.  Over the summer she had tingling in her legs.  The podiatrist tested her feet and she was lacking sensation  but reports pain in her feet - she is afraid it is neuropathy but does not want to go see the MD about this yet.  She feels like she has been stumbling more recently. She had 2 falls in the past 6 months but several more near misses where she has been able to catch herself.  She denies any dizziness or lightheadedness.  She denies pain currently but does  use CBD oil for her hands, knees and hips.  PAIN: Are you having pain? No and Yes: NPRS scale: 0/10 Pain location: low back   PERTINENT HISTORY:  OA, HTN, HLD, DM-II, diabetic neuropathy, anxiety/depression  PRECAUTIONS: Fall  RED FLAGS: None  WEIGHT BEARING RESTRICTIONS: No  FALLS:  Has patient fallen in last 6 months? Yes. Number of falls 2, 3x in the past year - usually losing her footing outside when working in the garden  LIVING ENVIRONMENT: Lives with: lives alone Lives in: [place] Stairs: Yes: External: 1 steps; on right going up Has following equipment at home: Single point cane and Environmental Consultant - 4 wheeled - *feels like she needs a grab bar installed to get into the tub  OCCUPATION: Retired  PLOF: Independent and Leisure: gardening, walking the dog around the yard, reading, adult coloring books  PATIENT GOALS: To not stumble so bad.   OBJECTIVE: (objective measures completed at initial evaluation unless otherwise dated)  DIAGNOSTIC FINDINGS:  09/20/2022 - DG HIP (WITH OR WITHOUT PELVIS) 2-3V LEFT FINDINGS: There is no evidence of hip fracture or dislocation. No significant arthropathy. No bony lesions or destruction.   IMPRESSION: Negative  PATIENT SURVEYS:  ABC scale: The Activities-Specific Balance Confidence (ABC) Scale 0% 10 20 30  40 50 60 70 80 90 100% No confidence<->completely confident  How confident are you that you will not lose your balance or become unsteady when you . . .  Date tested 08/18/2024   Walk around the house 90%  2. Walk up or down stairs 30%  3. Bend over and pick up a slipper from in front  of a closet floor 80%  4. Reach for a small can off a shelf at eye level 90%  5. Stand on tip toes and reach for something above your head 90%  6. Stand on a chair and reach for something 0%  7. Sweep the floor 90%  8. Walk outside the house to a car parked in the driveway 90%  9. Get into or out of a car 100%  10. Walk across a parking lot to the mall 60%  11. Walk up or down a ramp 20%  12. Walk in a crowded mall where people rapidly walk past you 20%  13. Are bumped into by people as you walk through the mall 20%  14. Step onto or off of an escalator while you are holding onto the railing 90%  15. Step onto or off an escalator while holding onto parcels such that you cannot hold onto the railing 50%  16. Walk outside on icy sidewalks 0%  Total: #/16 920 / 1600 = 57.5 %  Level of physical functioning: Moderate    LEFS  Extreme difficulty/unable (0), Quite a bit of difficulty (1), Moderate difficulty (2), Little difficulty (3), No difficulty (4) Survey date:  08/18/2024   Any of your usual work, housework or school activities 3  2. Usual hobbies, recreational or sporting activities 3  3. Getting into/out of the bath 3  4. Walking between rooms 4  5. Putting on socks/shoes 2  6. Squatting  0  7. Lifting an object, like a bag of groceries from the floor 3  8. Performing light activities around your home 3  9. Performing heavy activities around your home 2  10. Getting into/out of a car 0  11. Walking 2 blocks 0  12. Walking 1 mile 0  13. Going up/down 10 stairs (1 flight) 0  14. Standing for  1 hour 3  15.  sitting for 1 hour 0  16. Running on even ground 0  17. Running on uneven ground 0  18. Making sharp turns while running fast 0  19. Hopping  0  20. Rolling over in bed 4  Score total:  30 / 80 = 37.5 %  Functional limitation: Moderate     COGNITION: Overall cognitive status: Within functional limits for tasks assessed    SENSATION: Impaired (numbness) in L>R foot,  tingling in B LE  POSTURE:  rounded shoulders, forward head, and increased thoracic kyphosis  MUSCLE LENGTH: Hamstrings: Mild tight B ITB: Mild tight B Piriformis: Mild tight B Hip flexors:  Quads:  Heelcord:   LOWER EXTREMITY ROM: Grossly WFL  LOWER EXTREMITY MMT:  MMT Right eval Left eval R 10/06/24 L 10/06/24  Hip flexion 4- 3+ 4 4-  Hip extension 4- in S/L 4- in S/L 4+ in S/L 4+ in S/L  Hip abduction 3+ 3 4 4-  Hip adduction 3- 3- 4 4  Hip internal rotation 4- 3+ 4+ 4- p!  Hip external rotation 3+ 3+ 4 4  Knee flexion 4- 4- 4+ 4+  Knee extension 4 4 5  4+  Ankle dorsiflexion 4 4- 5 4+  Ankle plantarflexion 4 (10 SLS HR) 4- (9 SLS HR) 5 5  Ankle inversion      Ankle eversion       (Blank rows = not tested)  FUNCTIONAL TESTS:  5 times sit to stand: 17.18 sec w/o UE assist Timed up and go (TUG): 9.69 sec 10 meter walk test: 7.93 sec Gait speed: 4.14 ft/sec Functional gait assessment: 21/30, 19-24 = medium risk fall  Functional Gait  Assessment  Gait Level Surface Walks 20 ft in less than 5.5 sec, no assistive devices, good speed, no evidence for imbalance, normal gait pattern, deviates no more than 6 in outside of the 12 in walkway width.   Change in Gait Speed Able to smoothly change walking speed without loss of balance or gait deviation. Deviate no more than 6 in outside of the 12 in walkway width.   Gait with Horizontal Head Turns Performs head turns smoothly with slight change in gait velocity (eg, minor disruption to smooth gait path), deviates 6-10 in outside 12 in walkway width, or uses an assistive device.   Gait with Vertical Head Turns Performs head turns with no change in gait. Deviates no more than 6 in outside 12 in walkway width.   Gait and Pivot Turn Pivot turns safely within 3 sec and stops quickly with no loss of balance.   Step Over Obstacle Is able to step over one shoe box (4.5 in total height) without changing gait speed. No evidence of imbalance.    Gait with Narrow Base of Support Ambulates less than 4 steps heel to toe or cannot perform without assistance.   Gait with Eyes Closed Walks 20 ft, uses assistive device, slower speed, mild gait deviations, deviates 6-10 in outside 12 in walkway width. Ambulates 20 ft in less than 9 sec but greater than 7 sec.   Ambulating Backwards Walks 20 ft, slow speed, abnormal gait pattern, evidence for imbalance, deviates 10-15 in outside 12 in walkway width.   Steps Alternating feet, must use rail.   Total Score 21   FGA comment: 19-24 = medium risk fall       Interpretation of scores: Non-Specific Older Adults Cutoff Score: <=22/30 = risk of falls Parkinsons Disease Cutoff score <15/30=  fall risk (Hoehn & Yahr 1-4)  Minimally Clinically Important Difference (MCID)  Stroke (acute, subacute, and chronic) = MDC: 4.2 points Vestibular (acute) = MDC: 6 points Community Dwelling Older Adults =  MCID: 4 points Parkinsons Disease  =  MDC: 4.3 points  (Academy of Neu[employer]ic Physical Therapy (nd). Functional Gait Assessment. Retrieved from https://www.neuropt.org/docs/default-source/cpgs/core-outcome-measures/function-gait-assessment-pocket-guide-proof9-(2).pdf?sfvrsn=b8f35043_0.)  GAIT: Distance walked: clinic distances Assistive device utilized: None Level of assistance: Complete Independence Gait pattern: WFL   TODAY'S TREATMENT:    10/06/2024  THERAPEUTIC EXERCISE: To improve strength and endurance.  Demonstration, verbal and tactile cues throughout for technique.  NuStep - L5 x 6' (UE/LE) S/L GTB clam x 10 BATCA knee flexion/HS curls B LE 20# 2 x 15 BATCA knee extension B LE 5# 2 x 15  THERAPEUTIC ACTIVITIES: To improve functional performance.  Demonstration, verbal and tactile cues throughout for technique. LE MMT  NEUROMUSCULAR RE-EDUCATION: To improve balance, coordination, kinesthesia, posture, proprioception, and reduce fall risk. Bridge + GTB hip ABD/slight clam x 10 Gait fwd,  sideways and backward on unstable surface (red floor mat)   09/24/24 THERAPEUTIC EXERCISE: To improve strength, endurance, ROM, and flexibility.  Nustep L5x43min UE/LE Leg curls 15lb 2x15 BLE Leg extension 5lb x15; x 10  BLE  NEUROMUSCULAR RE-EDUCATION: To improve coordination, kinesthesia, posture, and proprioception.  Functional Gait  Assessment  Gait Level Surface Walks 20 ft in less than 5.5 sec, no assistive devices, good speed, no evidence for imbalance, normal gait pattern, deviates no more than 6 in outside of the 12 in walkway width.   Change in Gait Speed Able to smoothly change walking speed without loss of balance or gait deviation. Deviate no more than 6 in outside of the 12 in walkway width.   Gait with Horizontal Head Turns Performs head turns smoothly with no change in gait. Deviates no more than 6 in outside 12 in walkway width   Gait with Vertical Head Turns Performs head turns with no change in gait. Deviates no more than 6 in outside 12 in walkway width.   Gait and Pivot Turn Pivot turns safely within 3 sec and stops quickly with no loss of balance.   Step Over Obstacle Is able to step over one shoe box (4.5 in total height) without changing gait speed. No evidence of imbalance.   Gait with Narrow Base of Support Ambulates 7-9 steps.   Gait with Eyes Closed Walks 20 ft, uses assistive device, slower speed, mild gait deviations, deviates 6-10 in outside 12 in walkway width. Ambulates 20 ft in less than 9 sec but greater than 7 sec.   Ambulating Backwards Walks 20 ft, uses assistive device, slower speed, mild gait deviations, deviates 6-10 in outside 12 in walkway width.   Steps Alternating feet, must use rail.   Total Score 25     SLS + toe tap 12 to 6 o'clock x 5 each side with UE support Standing hip abduction RTB at ankles x 10 B Standing hip extension bent over RTB at ankles x 10 B Standing marching RTB at forefoot x 10 B  MT--- STM to L lateral patellar tendon- TTP  over this area, pain subsided as well   09/16/24 THERAPEUTIC EXERCISE: To improve strength, endurance, ROM, and flexibility.  Bike L2x42min Leg curl 15lb 2x10 BLE Leg ext 5lb 2x10 BLE Gastroc stretch on 1/2 foam roll x 30' B  NEUROMUSCULAR RE-EDUCATION: To improve coordination, kinesthesia, posture, and proprioception.  Gait with head turns- horizontal, vertical; direction changes 2x174ft- veered to  R side when turning to R with UE support standing on airex pad: Standing marching on airex x 10 B Standing head turns x 5 B trunk rotation x 5 B Squats x 10 - knee pain   Standing in corner: EC head turns x 5 EC trunk rotations x 5 Head nods with EC x 5   09/09/24  THERAPEUTIC EXERCISE: To improve strength and endurance.   NuStep - L4 x 6' (UE/LE)  THERAPEUTIC ACTIVITIES: To improve functional performance.  Demonstration, verbal and tactile cues throughout for technique.  5xSTS = 16.75 sec  NEUROMUSCULAR RE-EDUCATION: To improve balance, coordination, kinesthesia, posture, proprioception, and reduce fall risk. Standing with UE support on counter: Standing hip abduction with looped YTB at ankles 2 x 10 B Standing hip extension with looped YTB at ankles 2 x 10 B Standing marching with looped YTB at midfeet 2 x 10 B Corner balance progression with narrow BOS on firm surface with arms crossed on chest            Eyes open: static stance x 30 sec horiz head turns x 5 vertical head nods x 5 trunk rotation x 5 head diagonals x 5 Eyes closed: static stance x 15 sec horiz head turns x 5 vertical head nods x 5 trunk rotation x 5 head diagonals x 5   09/02/24 NEUROMUSCULAR RE-EDUCATION: To improve coordination, kinesthesia, posture, and proprioception.  Gait with head turns- horizontal, vertical 3x166ft At chair with UE support: Standing heel raises x 10 B Standing hip abduction YTB at ankles x 10 B Standing hip extension YTB at ankles x 10 B Standing marching YTB at ankles x  10 B Squats x 10- cues to hinge hips to avoid knee pain Tandem stance on airex 2x30'  Toe tap clock reach 12 to 6 R/L 4x each- more unsteady going PL, P Sidesteps balance beam 3x with therapist holding hand  THERAPEUTIC EXERCISE: To improve strength, endurance, ROM, and flexibility.  Nustep L4x73min   08/26/24 NEUROMUSCULAR RE-EDUCATION: To improve coordination, kinesthesia, posture, and proprioception.  At counter with UE support: Standing heel raises x 10 B Standing hip abduction x 10 B Standing hip extension x 10 B Standing marching x 10 B TRA sets hooklying 10x3' TRA set with hooklying clam RTB - did not feel much Bridge + TRA RTB x 10 SLR + QS x 10 BLE Tandem gait and backward walking along counter 3x each; added head turns as well  THERAPEUTIC EXERCISE: To improve strength, endurance, ROM, and flexibility.  Nustep L3x5min- fatigue S/L clamshells RTB x 10 BLE S/L reverse clamshells with light manual resistance x 10 RLE; no resistance LLE   08/18/2024 - Eval SELF CARE:  Reviewed eval findings and role of PT in addressing identified deficits as well as instruction in initial HEP (see below).    PATIENT EDUCATION:  Education details: HEP progression - bridge and clams advanced to GTB  Person educated: Patient Education method: Programmer, Multimedia, Demonstration, and Verbal cues Education comprehension: verbalized understanding, returned demonstration, verbal cues required, and needs further education  HOME EXERCISE PROGRAM: Access Code: [email or web address] URL: [email or web address] Date: 09/16/2024 Prepared by: Braylin Clark  Exercises - Heel Toe Raises with Counter Support  - 1 x daily - 7 x weekly - 2 sets - 10 reps - 3 sec hold - Supine Bridge with Resistance Band  - 1 x daily - 7 x weekly - 2 sets - 10 reps - Supine Active Straight Leg Raise  - 1 x daily - 7  x weekly - 2 sets - 10 reps - Clamshell with Resistance  - 1 x daily - 7 x weekly - 2 sets - 10 reps -  Standing Hip Abduction with Resistance at Ankles and Counter Support  - 1 x daily - 3-4 x weekly - 2 sets - 10 reps - 3 sec hold - Standing Hip Extension with Resistance at Ankles and Counter Support  - 1 x daily - 3-4 x weekly - 2 sets - 10 reps - 3 sec hold - Marching with Resistance  - 1 x daily - 3-4 x weekly - 2 sets - 10 reps - 3 sec hold - Standing Balance in Corner with Eyes Closed  - 1 x daily - 7 x weekly - 1 sets - 10 reps - Standing with Head Rotation  - 1 x daily - 7 x weekly - 1 sets - 10 reps - Standing with Head Nod  - 1 x daily - 7 x weekly - 1 sets - 10 reps   ASSESSMENT:  CLINICAL IMPRESSION: Tammy Gilbert reports she feels better with her walking - more steady, but would still like to continue to work on her balance.  MMT revealing gains across all muscle groups with greatest weakness remaining in hips.  Progressed resistance with bridges and clams to GTB with increased fatigue noted but otherwise good tolerance.  Assessed gait in multiple directions on unstable/complaint surfaces with no LOB observed.  Tammy Gilbert is progressing toward her PT goals and will benefit from continued skilled PT to address ongoing strength and balance deficits to improve mobility and activity tolerance with decreased pain interference and decreased risk for falls, however her insurance is changing as of 10/16/24 and she is uncertain how PT services will be covered so she wants to hold off on scheduling until she can verify her coverage for PT.   EVAL: Tammy Gilbert is a 78 y.o. female who was referred to physical therapy for evaluation and treatment for osteoarthritis of multiple joints with decreased strength and balance.   Patient reports she manages her pain with CBD oil with no pain reported on eval.  Her biggest concern is worsening unsteadiness on her feet with increased stumbling during gait.  She has had 2 falls in the past 6 months and 3 falls in the past year, with several near misses where she has  been able to catch herself.  Patient has deficits in B LE flexibility, B LE strength, abnormal posture, and impaired balance which are interfering with ADLs and are impacting quality of life.  On LEFS patient scored 30/80 demonstrating moderate functional limitation.  ABC scale score of 57.5% indicates a moderate level of physical functioning.  Examination revealed patient is at risk for falls and functional decline as evidenced by the following objective test measures: 5xSTS of 17.18 sec (>15 sec indicates increased risk for falls and decreased BLE power) FGA score of 21/30 indicates a medium risk for falls.  Gait speed 4.14 ft/sec and TUG of 9.69 sec were WFL.  Tammy Gilbert will benefit from skilled PT to address above deficits to improve mobility and activity tolerance with decreased pain interference and decreased risk for falls to help reach the maximal level of functional independence and mobility. Patient demonstrates understanding of this POC and is in agreement with this plan.    OBJECTIVE IMPAIRMENTS: Abnormal gait, decreased activity tolerance, decreased balance, decreased knowledge of condition, difficulty walking, decreased strength, decreased safety awareness, impaired flexibility, improper body mechanics, postural dysfunction, and pain.  ACTIVITY LIMITATIONS: bending, standing, squatting, stairs, transfers, and locomotion level  PARTICIPATION LIMITATIONS: community activity and yard work  PERSONAL FACTORS: Fitness, Past/current experiences, Time since onset of injury/illness/exacerbation, and 3+ comorbidities: OA, HTN, HLD, DM-II, diabetic neuropathy, anxiety/depression are also affecting patient's functional outcome.   REHAB POTENTIAL: Good  CLINICAL DECISION MAKING: Evolving/moderate complexity  EVALUATION COMPLEXITY: Moderate   GOALS: Goals reviewed with patient? Yes  SHORT TERM GOALS: Target date: 09/29/2024  Patient will be independent with initial HEP. Baseline: Initial HEP  provided on eval 08/26/24 - just about met, only needed cues with hip extension Goal status: MET - 09/09/24  2.  Patient will improve 5x STS time to </= 15 seconds to demonstrate improved functional strength and transfer efficiency. Baseline: 17.18 sec Goal status: MET - 09/24/24 - 13.84 seconds w/o UE support  LONG TERM GOALS: Target date: 11/10/2024  Patient will be independent with advanced/ongoing HEP to improve outcomes and carryover.  Baseline:  09/09/24 - HEP progressed with addition of YTB to standing hip exercises Goal status: IN PROGRESS - 10/06/24 - HEP progressed with bridge and clams advanced to GTB  2.  Patient will demonstrate improved B LE strength to >/= 4+/5 for improved stability and ease of mobility. Baseline: Refer to above LE MMT table Goal status: IN PROGRESS - 10/06/24 - Met for knees and ankles  3.  Patient will report >/= 40/80 on LEFS (MCID = 9 pts) to demonstrate improved functional ability. Baseline: 30 / 80 = 37.5 % Goal status: MET - 09/16/24 - 45 / 80 = 56.3 %  4.  Patient will demonstrate at least 25/30 on FGA to decrease risk of falls. Baseline: 21/30 Goal status: MET - 09/24/24 - 25/30  5.  Patient will report no further instances of falls.  Baseline: 2 falls in the past 6 months, 3 in the past year 09/16/24 - no falls thus far Goal status: IN PROGRESS - 10/06/24 - still no recent falls    PLAN:  PT FREQUENCY: 1-2x/week  PT DURATION: 12 weeks  PLANNED INTERVENTIONS: [id number]- PT Re-evaluation, 97750- Physical Performance Testing, 97110-Therapeutic exercises, 97530- Therapeutic activity, 97112- Neuromuscular re-education, 97535- Self Care, [id number]- Manual therapy, [id number]- Gait training, [phone]- Electrical stimulation (unattended), 97035- Ultrasound, [id number]- Ionotophoresis 4mg /ml Dexamethasone, 79439 (1-2 muscles), 20561 (3+ muscles)- Dry Needling, Patient/Family education, Balance training, Stair training, Taping, Joint mobilization, Vestibular training,  Cryotherapy, and Moist heat  PLAN FOR NEXT SESSION:  progress core and LE strengthening; dynamic balance training   Elijah CHRISTELLA Hidden, PT 10/06/2024, 10:22 AM  "
